# Patient Record
Sex: Female | Born: 1965 | Race: White | Hispanic: No | Marital: Married | Smoking: Never smoker
Health system: Southern US, Community
[De-identification: ages and names within clinical notes are randomized; demographics above are authoritative.]

## PROBLEM LIST (undated history)

## (undated) DIAGNOSIS — L309 Dermatitis, unspecified: Secondary | ICD-10-CM

## (undated) DIAGNOSIS — T7840XA Allergy, unspecified, initial encounter: Secondary | ICD-10-CM

## (undated) DIAGNOSIS — M25569 Pain in unspecified knee: Secondary | ICD-10-CM

## (undated) HISTORY — DX: Dermatitis, unspecified: L30.9

## (undated) HISTORY — DX: Allergy, unspecified, initial encounter: T78.40XA

## (undated) HISTORY — DX: Pain in unspecified knee: M25.569

---

## 1997-07-19 ENCOUNTER — Other Ambulatory Visit: Admission: RE | Admit: 1997-07-19 | Discharge: 1997-07-19 | Payer: Self-pay | Admitting: Obstetrics and Gynecology

## 1998-05-14 ENCOUNTER — Other Ambulatory Visit: Admission: RE | Admit: 1998-05-14 | Discharge: 1998-05-14 | Payer: Self-pay | Admitting: Obstetrics and Gynecology

## 1998-12-17 ENCOUNTER — Inpatient Hospital Stay (HOSPITAL_COMMUNITY): Admission: AD | Admit: 1998-12-17 | Discharge: 1998-12-17 | Payer: Self-pay | Admitting: Obstetrics and Gynecology

## 1998-12-18 ENCOUNTER — Inpatient Hospital Stay (HOSPITAL_COMMUNITY): Admission: AD | Admit: 1998-12-18 | Discharge: 1998-12-20 | Payer: Self-pay | Admitting: Obstetrics and Gynecology

## 1999-01-23 ENCOUNTER — Other Ambulatory Visit: Admission: RE | Admit: 1999-01-23 | Discharge: 1999-01-23 | Payer: Self-pay | Admitting: Obstetrics and Gynecology

## 2000-03-20 ENCOUNTER — Encounter: Admission: RE | Admit: 2000-03-20 | Discharge: 2000-03-20 | Payer: Self-pay | Admitting: Obstetrics and Gynecology

## 2000-03-20 ENCOUNTER — Encounter: Payer: Self-pay | Admitting: Obstetrics and Gynecology

## 2001-04-21 ENCOUNTER — Other Ambulatory Visit: Admission: RE | Admit: 2001-04-21 | Discharge: 2001-04-21 | Payer: Self-pay | Admitting: Obstetrics and Gynecology

## 2002-06-07 ENCOUNTER — Other Ambulatory Visit: Admission: RE | Admit: 2002-06-07 | Discharge: 2002-06-07 | Payer: Self-pay | Admitting: Obstetrics and Gynecology

## 2003-06-28 ENCOUNTER — Other Ambulatory Visit: Admission: RE | Admit: 2003-06-28 | Discharge: 2003-06-28 | Payer: Self-pay | Admitting: Obstetrics and Gynecology

## 2004-07-04 ENCOUNTER — Other Ambulatory Visit: Admission: RE | Admit: 2004-07-04 | Discharge: 2004-07-04 | Payer: Self-pay | Admitting: Obstetrics and Gynecology

## 2007-07-09 ENCOUNTER — Encounter: Admission: RE | Admit: 2007-07-09 | Discharge: 2007-07-09 | Payer: Self-pay | Admitting: Obstetrics and Gynecology

## 2007-12-21 ENCOUNTER — Encounter: Admission: RE | Admit: 2007-12-21 | Discharge: 2007-12-21 | Payer: Self-pay | Admitting: Obstetrics and Gynecology

## 2008-07-12 ENCOUNTER — Encounter: Admission: RE | Admit: 2008-07-12 | Discharge: 2008-07-12 | Payer: Self-pay | Admitting: Obstetrics and Gynecology

## 2012-10-14 ENCOUNTER — Ambulatory Visit (INDEPENDENT_AMBULATORY_CARE_PROVIDER_SITE_OTHER): Payer: BC Managed Care – PPO | Admitting: Physician Assistant

## 2012-10-14 ENCOUNTER — Encounter: Payer: Self-pay | Admitting: Physician Assistant

## 2012-10-14 VITALS — BP 120/71 | HR 80 | Temp 99.1°F | Ht 61.0 in | Wt 124.0 lb

## 2012-10-14 DIAGNOSIS — S40861S Insect bite (nonvenomous) of right upper arm, sequela: Secondary | ICD-10-CM

## 2012-10-14 DIAGNOSIS — L089 Local infection of the skin and subcutaneous tissue, unspecified: Secondary | ICD-10-CM

## 2012-10-14 DIAGNOSIS — L0291 Cutaneous abscess, unspecified: Secondary | ICD-10-CM

## 2012-10-14 DIAGNOSIS — IMO0002 Reserved for concepts with insufficient information to code with codable children: Secondary | ICD-10-CM

## 2012-10-14 DIAGNOSIS — L0390 Cellulitis, unspecified: Secondary | ICD-10-CM

## 2012-10-14 MED ORDER — HYDROXYZINE HCL 25 MG PO TABS: 25.0000 mg | ORAL_TABLET | Freq: Three times a day (TID) | ORAL | Status: DC | PRN

## 2012-10-14 MED ORDER — DOXYCYCLINE HYCLATE 100 MG PO TABS: 100.0000 mg | ORAL_TABLET | Freq: Two times a day (BID) | ORAL | Status: DC

## 2012-10-14 MED ORDER — MUPIROCIN 2 % EX OINT: TOPICAL_OINTMENT | Freq: Three times a day (TID) | CUTANEOUS | Status: DC

## 2012-10-14 NOTE — Progress Notes (Signed)
  Subjective:    Patient ID: Deanna Lawson, female    DOB: [birth year], 47 y.o.   MRN: [id number]  HPI 47 y/o female presents for pruritic and painful lesion on LUE that is worsening. She started having symptoms yesterday and does not recall a recent insect bite or trauma. She has tried cleaning with peroxide and using Neosporin with no relief. 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     Review of Systems Denies recent insect bite, fever, chills, sweats, nausea, vomiting, diarrhea. States that she had an isolated headache Monday. Endorses localized upper L arm pain, swelling and erythema.       Objective:   Physical Exam Localized erythema and edema of L arm, proximal to elbow with center ulceration and crusting. Localized area Warm to palpation. Negative for SOB, wheezing, respiratory distress or other systemic symptoms.         Assessment & Plan:  1. Presumed insect bite with cellulitis: Bacterial culture taken to r/o community acquired MRSA. Prescribed Doxycycline 100mg  BID x 10 days for empirical treatment along with Mupirocin OIntment for topical impetigo. Hydroxyzine 25mg  for symptomatic relief of pruritis. Patient will RTC if s/s worsen or persist.

## 2012-10-16 LAB — AEROBIC CULTURE

## 2013-03-05 ENCOUNTER — Ambulatory Visit (INDEPENDENT_AMBULATORY_CARE_PROVIDER_SITE_OTHER): Payer: BC Managed Care – PPO | Admitting: General Practice

## 2013-03-05 VITALS — BP 110/59 | HR 76 | Temp 97.0°F | Ht 61.0 in | Wt 120.0 lb

## 2013-03-05 DIAGNOSIS — J0100 Acute maxillary sinusitis, unspecified: Secondary | ICD-10-CM

## 2013-03-05 DIAGNOSIS — J029 Acute pharyngitis, unspecified: Secondary | ICD-10-CM

## 2013-03-05 LAB — POCT RAPID STREP A (OFFICE): Rapid Strep A Screen: NEGATIVE

## 2013-03-05 MED ORDER — AZITHROMYCIN 250 MG PO TABS: ORAL_TABLET | ORAL | Status: DC

## 2013-03-05 NOTE — Progress Notes (Signed)
   Subjective:    Patient ID: Deanna Lawson, female    DOB: Oct 10, 1965, 47 y.o.   MRN: [id number]  Sinusitis This is a new problem. The current episode started in the past 7 days. The problem has been gradually worsening since onset. There has been no fever. Associated symptoms include coughing, ear pain, sinus pressure, sneezing and a sore throat. Pertinent negatives include no chills or shortness of breath. Past treatments include nothing.      Review of Systems  Constitutional: Negative for fever and chills.  HENT: Positive for ear pain, sinus pressure, sneezing and sore throat.   Respiratory: Positive for cough. Negative for chest tightness and shortness of breath.   Cardiovascular: Negative for chest pain and palpitations.  All other systems reviewed and are negative.       Objective:   Physical Exam  Constitutional: She is oriented to person, place, and time. She appears well-developed and well-nourished.  HENT:  Head: Normocephalic.  Right Ear: External ear normal.  Left Ear: External ear normal.  Nose: Right sinus exhibits maxillary sinus tenderness. Left sinus exhibits maxillary sinus tenderness.  Cardiovascular: Normal rate, regular rhythm and normal heart sounds.   Pulmonary/Chest: Effort normal and breath sounds normal. No respiratory distress. She exhibits no tenderness.  Neurological: She is alert and oriented to person, place, and time.  Skin: Skin is warm and dry.  Psychiatric: She has a normal mood and affect.    Results for orders placed in visit on 03/05/13  POCT RAPID STREP A (OFFICE)      Result Value Range   Rapid Strep A Screen Negative  Negative        Assessment & Plan:  1. Acute pharyngitis  - POCT rapid strep A  2. Sinusitis, acute maxillary  - azithromycin (ZITHROMAX) 250 MG tablet; Take as directed  Dispense: 6 tablet; Refill: 0 -Increase fluid intake Motrin or tylenol OTC OTC decongestant Throat lozenges if help Proper  handwashing Patient verbalized understanding Coralie Keens, FNP-C

## 2013-03-05 NOTE — Patient Instructions (Signed)

## 2013-08-26 ENCOUNTER — Other Ambulatory Visit: Payer: Self-pay | Admitting: Obstetrics and Gynecology

## 2013-08-26 DIAGNOSIS — R928 Other abnormal and inconclusive findings on diagnostic imaging of breast: Secondary | ICD-10-CM

## 2013-09-09 ENCOUNTER — Ambulatory Visit
Admission: RE | Admit: 2013-09-09 | Discharge: 2013-09-09 | Disposition: A | Discharge status: Home / Self Care | Payer: BC Managed Care – PPO | Source: Ambulatory Visit | Attending: Obstetrics and Gynecology | Admitting: Obstetrics and Gynecology

## 2013-09-09 DIAGNOSIS — R928 Other abnormal and inconclusive findings on diagnostic imaging of breast: Secondary | ICD-10-CM

## 2014-04-11 ENCOUNTER — Encounter (INDEPENDENT_AMBULATORY_CARE_PROVIDER_SITE_OTHER): Payer: Self-pay

## 2014-04-11 ENCOUNTER — Encounter: Payer: Self-pay | Admitting: Family Medicine

## 2014-04-11 ENCOUNTER — Ambulatory Visit (INDEPENDENT_AMBULATORY_CARE_PROVIDER_SITE_OTHER): Payer: BLUE CROSS/BLUE SHIELD | Admitting: Family Medicine

## 2014-04-11 VITALS — BP 110/63 | HR 79 | Temp 98.1°F | Ht 61.0 in | Wt 124.8 lb

## 2014-04-11 DIAGNOSIS — R21 Rash and other nonspecific skin eruption: Secondary | ICD-10-CM

## 2014-04-11 DIAGNOSIS — J0100 Acute maxillary sinusitis, unspecified: Secondary | ICD-10-CM

## 2014-04-11 MED ORDER — BETAMETHASONE DIPROPIONATE 0.05 % EX CREA: TOPICAL_CREAM | Freq: Two times a day (BID) | CUTANEOUS | Status: DC

## 2014-04-11 MED ORDER — LEVOFLOXACIN 500 MG PO TABS: 500.0000 mg | ORAL_TABLET | Freq: Every day | ORAL | Status: DC

## 2014-04-11 NOTE — Progress Notes (Signed)
   Subjective:    Patient ID: Deanna Lawson, female    DOB: [birth year], 49 y.o.   MRN: [id number]  HPI Patient is here for c/o uri sx's and left facial pain.  She has a rash on her arms and she states she uses betamethasone cream and would like a refill.  Review of Systems  Constitutional: Negative for fever.  HENT: Negative for ear pain.   Eyes: Negative for discharge.  Respiratory: Negative for cough.   Cardiovascular: Negative for chest pain.  Gastrointestinal: Negative for abdominal distention.  Endocrine: Negative for polyuria.  Genitourinary: Negative for difficulty urinating.  Musculoskeletal: Negative for gait problem and neck pain.  Skin: Negative for color change and rash.  Neurological: Negative for speech difficulty and headaches.  Psychiatric/Behavioral: Negative for agitation.       Objective:    BP 110/63 mmHg  Pulse 79  Temp(Src) 98.1 F (36.7 C) (Oral)  Ht 5\' 1"  (1.549 m)  Wt 124 lb 12.8 oz (56.609 kg)  BMI 23.59 kg/m2 Physical Exam  Constitutional: She is oriented to person, place, and time. She appears well-developed and well-nourished.  HENT:  Head: Normocephalic and atraumatic.  Mouth/Throat: Oropharynx is clear and moist.  Eyes: Pupils are equal, round, and reactive to light.  Neck: Normal range of motion. Neck supple.  Cardiovascular: Normal rate and regular rhythm.   No murmur heard. Pulmonary/Chest: Effort normal and breath sounds normal.  Abdominal: Soft. Bowel sounds are normal. There is no tenderness.  Neurological: She is alert and oriented to person, place, and time.  Skin: Skin is warm and dry.  Psychiatric: She has a normal mood and affect.          Assessment & Plan:     ICD-9-CM ICD-10-CM   1. Subacute maxillary sinusitis 461.0 J01.00 levofloxacin (LEVAQUIN) 500 MG tablet  2. Rash and nonspecific skin eruption 782.1 R21 betamethasone dipropionate (DIPROLENE) 0.05 % cream     Return if symptoms worsen or fail to  improve.  Deatra CanterWilliam J Laren Orama FNP

## 2015-05-12 ENCOUNTER — Encounter: Payer: Self-pay | Admitting: Nurse Practitioner

## 2015-05-12 ENCOUNTER — Ambulatory Visit (INDEPENDENT_AMBULATORY_CARE_PROVIDER_SITE_OTHER): Payer: BC Managed Care – PPO | Admitting: Nurse Practitioner

## 2015-05-12 VITALS — BP 124/69 | HR 75 | Temp 97.5°F | Ht 61.0 in | Wt 127.0 lb

## 2015-05-12 DIAGNOSIS — J029 Acute pharyngitis, unspecified: Secondary | ICD-10-CM | POA: Diagnosis not present

## 2015-05-12 DIAGNOSIS — J069 Acute upper respiratory infection, unspecified: Secondary | ICD-10-CM

## 2015-05-12 LAB — POCT INFLUENZA A/B
Influenza A, POC: POSITIVE — AB
Influenza B, POC: NEGATIVE

## 2015-05-12 LAB — POCT RAPID STREP A (OFFICE): Rapid Strep A Screen: NEGATIVE

## 2015-05-12 MED ORDER — AZITHROMYCIN 250 MG PO TABS: ORAL_TABLET | ORAL | Status: DC

## 2015-05-12 MED ORDER — OSELTAMIVIR PHOSPHATE 75 MG PO CAPS: 75.0000 mg | ORAL_CAPSULE | Freq: Two times a day (BID) | ORAL | Status: DC

## 2015-05-12 NOTE — Patient Instructions (Signed)
Upper Respiratory Infection, Adult Most upper respiratory infections (URIs) are a viral infection of the air passages leading to the lungs. A URI affects the nose, throat, and upper air passages. The most common type of URI is nasopharyngitis and is typically referred to as "the common cold." URIs run their course and usually go away on their own. Most of the time, a URI does not require medical attention, but sometimes a bacterial infection in the upper airways can follow a viral infection. This is called a secondary infection. Sinus and middle ear infections are common types of secondary upper respiratory infections. Bacterial pneumonia can also complicate a URI. A URI can worsen asthma and chronic obstructive pulmonary disease (COPD). Sometimes, these complications can require emergency medical care and may be life threatening.  CAUSES Almost all URIs are caused by viruses. A virus is a type of germ and can spread from one person to another.  RISKS FACTORS You may be at risk for a URI if:   You smoke.   You have chronic heart or lung disease.  You have a weakened defense (immune) system.   You are very young or very old.   You have nasal allergies or asthma.  You work in crowded or poorly ventilated areas.  You work in health care facilities or schools. SIGNS AND SYMPTOMS  Symptoms typically develop 2-3 days after you come in contact with a cold virus. Most viral URIs last 7-10 days. However, viral URIs from the influenza virus (flu virus) can last 14-18 days and are typically more severe. Symptoms may include:   Runny or stuffy (congested) nose.   Sneezing.   Cough.   Sore throat.   Headache.   Fatigue.   Fever.   Loss of appetite.   Pain in your forehead, behind your eyes, and over your cheekbones (sinus pain).  Muscle aches.  DIAGNOSIS  Your health care provider may diagnose a URI by:  Physical exam.  Tests to check that your symptoms are not due to  another condition such as:  Strep throat.  Sinusitis.  Pneumonia.  Asthma. TREATMENT  A URI goes away on its own with time. It cannot be cured with medicines, but medicines may be prescribed or recommended to relieve symptoms. Medicines may help:  Reduce your fever.  Reduce your cough.  Relieve nasal congestion. HOME CARE INSTRUCTIONS   Take medicines only as directed by your health care provider.   Gargle warm saltwater or take cough drops to comfort your throat as directed by your health care provider.  Use a warm mist humidifier or inhale steam from a shower to increase air moisture. This may make it easier to breathe.  Drink enough fluid to keep your urine clear or pale yellow.   Eat soups and other clear broths and maintain good nutrition.   Rest as needed.   Return to work when your temperature has returned to normal or as your health care provider advises. You may need to stay home longer to avoid infecting others. You can also use a face mask and careful hand washing to prevent spread of the virus.  Increase the usage of your inhaler if you have asthma.   Do not use any tobacco products, including cigarettes, chewing tobacco, or electronic cigarettes. If you need help quitting, ask your health care provider. PREVENTION  The best way to protect yourself from getting a cold is to practice good hygiene.   Avoid oral or hand contact with people with cold   symptoms.   Wash your hands often if contact occurs.  There is no clear evidence that vitamin C, vitamin E, echinacea, or exercise reduces the chance of developing a cold. However, it is always recommended to get plenty of rest, exercise, and practice good nutrition.  SEEK MEDICAL CARE IF:   You are getting worse rather than better.   Your symptoms are not controlled by medicine.   You have chills.  You have worsening shortness of breath.  You have brown or red mucus.  You have yellow or brown nasal  discharge.  You have pain in your face, especially when you bend forward.  You have a fever.  You have swollen neck glands.  You have pain while swallowing.  You have white areas in the back of your throat. SEEK IMMEDIATE MEDICAL CARE IF:   You have severe or persistent:  Headache.  Ear pain.  Sinus pain.  Chest pain.  You have chronic lung disease and any of the following:  Wheezing.  Prolonged cough.  Coughing up blood.  A change in your usual mucus.  You have a stiff neck.  You have changes in your:  Vision.  Hearing.  Thinking.  Mood. MAKE SURE YOU:   Understand these instructions.  Will watch your condition.  Will get help right away if you are not doing well or get worse.   This information is not intended to replace advice given to you by your health care provider. Make sure you discuss any questions you have with your health care provider.   Document Released: 08/27/2000 Document Revised: 07/18/2014 Document Reviewed: 06/08/2013 Elsevier Interactive Patient Education 2016 Elsevier Inc.  

## 2015-05-12 NOTE — Progress Notes (Signed)
Subjective:    Patient ID: Deanna Lawson, female    DOB: Jul 30, 1965, 50 y.o.   MRN: [id number]   Patient in c/o:  Sore Throat  This is a new problem. The current episode started in the past 7 days. The problem has been gradually improving. Neither side of throat is experiencing more pain than the other. The maximum temperature recorded prior to her arrival was 101 - 101.9 F (fever started yesterday). The pain is at a severity of 6/10. The pain is moderate. Associated symptoms include congestion, coughing and a hoarse voice. Pertinent negatives include no ear discharge or shortness of breath. She has tried NSAIDs for the symptoms. The treatment provided moderate relief.      Review of Systems  Constitutional: Positive for fever.  HENT: Positive for congestion and hoarse voice. Negative for ear discharge.   Respiratory: Positive for cough. Negative for shortness of breath.   Cardiovascular: Negative.   Genitourinary: Negative.   Neurological: Negative.   Psychiatric/Behavioral: Negative.   All other systems reviewed and are negative.      Objective:   Physical Exam  Constitutional: She appears well-developed and well-nourished. No distress.  HENT:  Right Ear: Hearing, tympanic membrane, external ear and ear canal normal.  Left Ear: Hearing, tympanic membrane, external ear and ear canal normal.  Nose: Mucosal edema and rhinorrhea present. Right sinus exhibits no maxillary sinus tenderness and no frontal sinus tenderness. Left sinus exhibits no maxillary sinus tenderness and no frontal sinus tenderness.  Mouth/Throat: Uvula is midline, oropharynx is clear and moist and mucous membranes are normal.  Neck: Normal range of motion. Neck supple.  Cardiovascular: Normal rate, regular rhythm and normal heart sounds.   Pulmonary/Chest: Effort normal and breath sounds normal.  Musculoskeletal: Normal range of motion.  Neurological: She is alert.  Skin: Skin is warm.  Psychiatric: She has  a normal mood and affect. Her behavior is normal. Judgment and thought content normal.   BP 124/69 mmHg  Pulse 75  Temp(Src) 97.5 F (36.4 C) (Oral)  Ht  (1.549 m)  Wt 127 lb (57.607 kg)  BMI 24.01 kg/m2       Assessment & Plan:   1. Sore throat   2. Acute upper respiratory infection   3. Influenza  1. Take meds as prescribed 2. Use a cool mist humidifier especially during the winter months and when heat has been humid. 3. Use saline nose sprays frequently 4. Saline irrigations of the nose can be very helpful if done frequently.  * 4X daily for 1 week*  * Use of a nettie pot can be helpful with this. Follow directions with this* 5. Drink plenty of fluids 6. Keep thermostat turn down low 7.For any cough or congestion  Use plain Mucinex- regular strength or max strength is fine   * Children- consult with Pharmacist for dosing 8. For fever or aces or pains- take tylenol or ibuprofen appropriate for age and weight.  * for fevers greater than 101 orally you may alternate ibuprofen and tylenol every  3 hours.   Meds ordered this encounter  Medications  . azithromycin (ZITHROMAX Z-PAK) 250 MG tablet    Sig: As directed    Dispense:  1 each    Refill:  0    Order Specific Question:  Supervising Provider    Answer:  Ernestina Penna [[id number]]  . oseltamivir (TAMIFLU) 75 MG capsule    Sig: Take 1 capsule (75 mg total) by mouth 2 (two)  times daily.    Dispense:  10 capsule    Refill:  0    Order Specific Question:  Supervising Provider    Answer:  Ernestina Penna [[id number]]     Mary-Margaret Daphine Deutscher, FNP

## 2015-05-14 NOTE — Addendum Note (Signed)
Addended by: Bennie Pierini on: 05/14/2015 08:47 AM   Modules accepted: Level of Service, SmartSet

## 2015-12-28 ENCOUNTER — Other Ambulatory Visit: Payer: Self-pay | Admitting: Obstetrics and Gynecology

## 2015-12-28 DIAGNOSIS — R928 Other abnormal and inconclusive findings on diagnostic imaging of breast: Secondary | ICD-10-CM

## 2016-01-09 ENCOUNTER — Ambulatory Visit
Admission: RE | Admit: 2016-01-09 | Discharge: 2016-01-09 | Disposition: A | Discharge status: Home / Self Care | Payer: BC Managed Care – PPO | Source: Ambulatory Visit | Attending: Obstetrics and Gynecology | Admitting: Obstetrics and Gynecology

## 2016-01-09 DIAGNOSIS — R928 Other abnormal and inconclusive findings on diagnostic imaging of breast: Secondary | ICD-10-CM

## 2017-02-19 ENCOUNTER — Encounter: Payer: Self-pay | Admitting: Physician Assistant

## 2017-02-19 ENCOUNTER — Ambulatory Visit: Payer: BC Managed Care – PPO | Admitting: Physician Assistant

## 2017-02-19 VITALS — BP 115/71 | HR 72 | Temp 97.6°F | Ht 61.0 in | Wt 134.6 lb

## 2017-02-19 DIAGNOSIS — K1370 Unspecified lesions of oral mucosa: Secondary | ICD-10-CM

## 2017-02-19 MED ORDER — PREDNISONE 10 MG (21) PO TBPK: ORAL_TABLET | ORAL | 0 refills | Status: DC

## 2017-02-19 MED ORDER — CEPHALEXIN 500 MG PO CAPS: 500.0000 mg | ORAL_CAPSULE | Freq: Four times a day (QID) | ORAL | 0 refills | Status: DC

## 2017-02-19 NOTE — Patient Instructions (Signed)
Canker Sores Canker sores are small, painful sores that develop inside your mouth. They may also be called aphthous ulcers. You can get canker sores on the inside of your lips or cheeks, on your tongue, or anywhere inside your mouth. You can have just one canker sore or several of them. Canker sores cannot be passed from one person to another (noncontagious). These sores are different than the sores that you may get on the outside of your lips (cold sores or fever blisters). Canker sores usually start as painful red bumps. Then they turn into small white, yellow, or gray ulcers that have red borders. The ulcers may be quite painful. The pain may be worse when you eat or drink. What are the causes? The cause of this condition is not known. What increases the risk? This condition is more likely to develop in:  Women.  People in their teens or 20s.  Women who are having their menstrual period.  People who are under a lot of emotional stress.  People who do not get enough iron or B vitamins.  People who have poor oral hygiene.  People who have an injury inside the mouth. This can happen after having dental work or from chewing something hard.  What are the signs or symptoms? Along with the canker sore, symptoms may also include:  Fever.  Fatigue.  Swollen lymph nodes in your neck.  How is this diagnosed? This condition can be diagnosed based on your symptoms. Your health care provider will also examine your mouth. Your health care provider may also do tests if you get canker sores often or if they are very bad. Tests may include:  Blood tests to rule out other causes of canker sores.  Taking swabs from the sore to check for infection.  Taking a small piece of skin from the sore (biopsy) to test it for cancer.  How is this treated? Most canker sores clear up without treatment in about 10 days. Home care is usually the only treatment that you will need. Over-the-counter medicines  can relieve discomfort.If you have severe canker sores, your health care provider may prescribe:  Numbing ointment to relieve pain.  Vitamins.  Steroid medicines. These may be given as: ? Oral pills. ? Mouth rinses. ? Gels.  Antibiotic mouth rinse.  Follow these instructions at home:  Apply, take, or use medicines only as directed by your health care provider. These include vitamins.  If you were prescribed an antibiotic mouth rinse, finish all of it even if you start to feel better.  Until the sores are healed: ? Do not drink coffee or citrus juices. ? Do not eat spicy or salty foods.  Use a mild, over-the-counter mouth rinse as directed by your health care provider.  Practice good oral hygiene. ? Floss your teeth every day. ? Brush your teeth with a soft brush twice each day. Contact a health care provider if:  Your symptoms do not get better after two weeks.  You also have a fever or swollen glands.  You get canker sores often.  You have a canker sore that is getting larger.  You cannot eat or drink due to your canker sores. This information is not intended to replace advice given to you by your health care provider. Make sure you discuss any questions you have with your health care provider. Document Released: 06/28/2010 Document Revised: 08/09/2015 Document Reviewed: 02/01/2014 Elsevier Interactive Patient Education  2018 Elsevier Inc.  

## 2017-02-24 NOTE — Progress Notes (Signed)
BP 115/71   Pulse 72   Temp 97.6 F (36.4 C) (Oral)   Ht 5\' 1"  (1.549 m)   Wt 134 lb 9.6 oz (61.1 kg)   BMI 25.43 kg/m    Subjective:    Patient ID: Deanna Lawson, female    DOB: [birth year], 51 y.o.   MRN: [id number]  HPI: Deanna DominionKaren M Deterding is a 51 y.o. female presenting on 02/19/2017 for Mouth Lesions (right side of mouth, but also swollen on outside of mouth & sensitive to touch for 3-4 days, no known fever)  Has had areas in the past but have never lasted this long. Denies any prior infections or other respiratory symptoms.  Has tried OTC without help. Pain is quite severe.  Relevant past medical, surgical, family and social history reviewed and updated as indicated. Allergies and medications reviewed and updated.  Past Medical History:  Diagnosis Date  . Dermatitis   . Knee pain    Left    History reviewed. No pertinent surgical history.  Review of Systems  Constitutional: Negative.  Negative for fatigue and fever.  HENT: Positive for mouth sores. Negative for congestion, hearing loss, sinus pressure and voice change.   Eyes: Negative.   Respiratory: Negative.   Cardiovascular: Negative.   Gastrointestinal: Negative.   Genitourinary: Negative.     Allergies as of 02/19/2017      Reactions   Bee Venom Swelling   Sulfa Antibiotics Rash      Medication List        Accurate as of 02/19/17 11:59 PM. Always use your most recent med list.          cephALEXin 500 MG capsule Commonly known as:  KEFLEX Take 1 capsule (500 mg total) by mouth 4 (four) times daily.   cetirizine 10 MG tablet Commonly known as:  ZYRTEC Take 10 mg by mouth daily.   predniSONE 10 MG (21) Tbpk tablet Commonly known as:  STERAPRED UNI-PAK 21 TAB As directed x 6 days   triamcinolone ointment 0.1 % Commonly known as:  KENALOG Apply 1 application topically 2 (two) times daily. During flare-up          Objective:    BP 115/71   Pulse 72   Temp 97.6 F (36.4 C) (Oral)   Ht 5'  1" (1.549 m)   Wt 134 lb 9.6 oz (61.1 kg)   BMI 25.43 kg/m   Allergies  Allergen Reactions  . Bee Venom Swelling  . Sulfa Antibiotics Rash    Physical Exam  Constitutional: She is oriented to person, place, and time. She appears well-developed and well-nourished.  HENT:  Head: Normocephalic and atraumatic.  Mouth/Throat: Oropharynx is clear and moist. Oral lesions present. No lacerations.    Eyes: Conjunctivae and EOM are normal. Pupils are equal, round, and reactive to light.  Cardiovascular: Normal rate, regular rhythm, normal heart sounds and intact distal pulses.  Pulmonary/Chest: Effort normal and breath sounds normal.  Abdominal: Soft. Bowel sounds are normal.  Neurological: She is alert and oriented to person, place, and time. She has normal reflexes.  Skin: Skin is warm and dry. No rash noted.  Psychiatric: She has a normal mood and affect. Her behavior is normal. Judgment and thought content normal.        Assessment & Plan:   1. Mouth lesion - triamcinolone ointment (KENALOG) 0.1 %; Apply 1 application topically 2 (two) times daily. During flare-up - cephALEXin (KEFLEX) 500 MG capsule; Take 1 capsule (500 mg  total) by mouth 4 (four) times daily.  Dispense: 40 capsule; Refill: 0 - predniSONE (STERAPRED UNI-PAK 21 TAB) 10 MG (21) TBPK tablet; As directed x 6 days  Dispense: 21 tablet; Refill: 0    Current Outpatient Medications:  .  cephALEXin (KEFLEX) 500 MG capsule, Take 1 capsule (500 mg total) by mouth 4 (four) times daily., Disp: 40 capsule, Rfl: 0 .  cetirizine (ZYRTEC) 10 MG tablet, Take 10 mg by mouth daily., Disp: , Rfl:  .  predniSONE (STERAPRED UNI-PAK 21 TAB) 10 MG (21) TBPK tablet, As directed x 6 days, Disp: 21 tablet, Rfl: 0 .  triamcinolone ointment (KENALOG) 0.1 %, Apply 1 application topically 2 (two) times daily. During flare-up, Disp: , Rfl:  Continue all other maintenance medications as listed above.  Follow up plan: Follow-up as needed or  worsening of symptoms. Call office for any issues.  Educational handout given for aphthous ulcer  Remus LofflerAngel S. Alasdair Kleve PA-C [facility] [address]  [place], [place] [address] [phone]   02/24/2017, 3:52 PM

## 2017-03-11 ENCOUNTER — Other Ambulatory Visit: Payer: Self-pay | Admitting: Obstetrics and Gynecology

## 2017-03-11 DIAGNOSIS — R928 Other abnormal and inconclusive findings on diagnostic imaging of breast: Secondary | ICD-10-CM

## 2017-03-18 ENCOUNTER — Ambulatory Visit
Admission: RE | Admit: 2017-03-18 | Discharge: 2017-03-18 | Disposition: A | Discharge status: Home / Self Care | Payer: BC Managed Care – PPO | Source: Ambulatory Visit | Attending: Obstetrics and Gynecology | Admitting: Obstetrics and Gynecology

## 2017-03-18 DIAGNOSIS — R928 Other abnormal and inconclusive findings on diagnostic imaging of breast: Secondary | ICD-10-CM

## 2017-05-09 ENCOUNTER — Encounter: Payer: Self-pay | Admitting: Physician Assistant

## 2017-05-09 ENCOUNTER — Ambulatory Visit: Payer: BC Managed Care – PPO | Admitting: Physician Assistant

## 2017-05-09 VITALS — BP 119/81 | HR 84 | Temp 98.5°F | Ht 61.0 in | Wt 136.6 lb

## 2017-05-09 DIAGNOSIS — J0100 Acute maxillary sinusitis, unspecified: Secondary | ICD-10-CM | POA: Diagnosis not present

## 2017-05-09 DIAGNOSIS — J029 Acute pharyngitis, unspecified: Secondary | ICD-10-CM

## 2017-05-09 MED ORDER — AMOXICILLIN-POT CLAVULANATE 875-125 MG PO TABS: 1.0000 | ORAL_TABLET | Freq: Two times a day (BID) | ORAL | 0 refills | Status: DC

## 2017-05-11 LAB — CULTURE, GROUP A STREP

## 2017-05-11 LAB — RAPID STREP SCREEN (MED CTR MEBANE ONLY): Strep Gp A Ag, IA W/Reflex: NEGATIVE

## 2017-05-11 NOTE — Progress Notes (Signed)
BP 119/81   Pulse 84   Temp 98.5 F (36.9 C) (Oral)   Ht 5\' 1"  (1.549 m)   Wt 136 lb 9.6 oz (62 kg)   BMI 25.81 kg/m    Subjective:    Patient ID: Deanna Lawson, female    DOB: 08-01-1965, 52 y.o.   MRN: [id number]  HPI: Deanna Lawson is a 52 y.o. female presenting on 05/09/2017 for Facial Pain and Sore Throat  This patient has had many days of sinus headache and postnasal drainage. There is copious drainage at times. Denies any fever at this time. There has been a history of sinus infections in the past.  No history of sinus surgery. There is cough at night. It has become more prevalent in recent days.   Past Medical History:  Diagnosis Date  . Dermatitis   . Knee pain    Left   Relevant past medical, surgical, family and social history reviewed and updated as indicated. Interim medical history since our last visit reviewed. Allergies and medications reviewed and updated. DATA REVIEWED: CHART IN EPIC  Family History reviewed for pertinent findings.  Review of Systems  Constitutional: Positive for chills and fatigue. Negative for activity change and appetite change.  HENT: Positive for congestion, postnasal drip and sore throat.   Eyes: Negative.   Respiratory: Positive for cough and wheezing.   Cardiovascular: Negative.  Negative for chest pain, palpitations and leg swelling.  Gastrointestinal: Negative.   Genitourinary: Negative.   Musculoskeletal: Negative.   Skin: Negative.   Neurological: Positive for headaches.    Allergies as of 05/09/2017      Reactions   Bee Venom Swelling   Sulfa Antibiotics Rash      Medication List        Accurate as of 05/09/17 11:59 PM. Always use your most recent med list.          amoxicillin-clavulanate 875-125 MG tablet Commonly known as:  AUGMENTIN Take 1 tablet by mouth 2 (two) times daily.   cetirizine 10 MG tablet Commonly known as:  ZYRTEC Take 10 mg by mouth daily.   triamcinolone ointment 0.1 % Commonly  known as:  KENALOG Apply 1 application topically 2 (two) times daily. During flare-up          Objective:    BP 119/81   Pulse 84   Temp 98.5 F (36.9 C) (Oral)   Ht 5\' 1"  (1.549 m)   Wt 136 lb 9.6 oz (62 kg)   BMI 25.81 kg/m   Allergies  Allergen Reactions  . Bee Venom Swelling  . Sulfa Antibiotics Rash    Wt Readings from Last 3 Encounters:  05/09/17 136 lb 9.6 oz (62 kg)  02/19/17 134 lb 9.6 oz (61.1 kg)  05/12/15 127 lb (57.6 kg)    Physical Exam  Constitutional: She is oriented to person, place, and time. She appears well-developed and well-nourished.  HENT:  Head: Normocephalic and atraumatic.  Right Ear: Tympanic membrane and external ear normal. No middle ear effusion.  Left Ear: Tympanic membrane and external ear normal.  No middle ear effusion.  Nose: Mucosal edema and rhinorrhea present. Right sinus exhibits no maxillary sinus tenderness. Left sinus exhibits no maxillary sinus tenderness.  Mouth/Throat: Uvula is midline. Posterior oropharyngeal erythema present.  Eyes: Conjunctivae and EOM are normal. Pupils are equal, round, and reactive to light. Right eye exhibits no discharge. Left eye exhibits no discharge.  Neck: Normal range of motion.  Cardiovascular: Normal rate, regular  rhythm and normal heart sounds.  Pulmonary/Chest: Effort normal and breath sounds normal. No respiratory distress. She has no wheezes.  Abdominal: Soft.  Lymphadenopathy:    She has no cervical adenopathy.  Neurological: She is alert and oriented to person, place, and time.  Skin: Skin is warm and dry.  Psychiatric: She has a normal mood and affect.    Results for orders placed or performed in visit on 05/09/17  Rapid Strep Screen ([facility] at [facility])  Result Value Ref Range   Strep Gp A Ag, IA W/Reflex Negative Negative  Culture, Group A Strep  Result Value Ref Range   Strep A Culture Comment   Culture, Group A Strep  Result Value Ref Range   Strep A Culture CANCELED         Assessment & Plan:   1. Sore throat - Rapid Strep Screen ([facility]) - Culture, Group A Strep - Culture, Group A Strep  2. Acute non-recurrent maxillary sinusitis - amoxicillin-clavulanate (AUGMENTIN) 875-125 MG tablet; Take 1 tablet by mouth 2 (two) times daily.  Dispense: 20 tablet; Refill: 0   Continue all other maintenance medications as listed above.  Follow up plan: No Follow-up on file.  Educational handout given for survey  Remus LofflerAngel S. Evaan Tidwell PA-C [facility] [address]  [place], [place] [address] [phone]   05/11/2017, 10:06 PM

## 2017-05-12 LAB — CULTURE, GROUP A STREP: Strep A Culture: NEGATIVE

## 2017-05-25 ENCOUNTER — Ambulatory Visit: Payer: BC Managed Care – PPO | Admitting: Family Medicine

## 2017-05-25 ENCOUNTER — Encounter: Payer: Self-pay | Admitting: Family Medicine

## 2017-05-25 VITALS — BP 127/85 | HR 82 | Temp 98.8°F | Ht 61.0 in | Wt 136.0 lb

## 2017-05-25 DIAGNOSIS — J0111 Acute recurrent frontal sinusitis: Secondary | ICD-10-CM | POA: Diagnosis not present

## 2017-05-25 MED ORDER — METHYLPREDNISOLONE ACETATE 80 MG/ML IJ SUSP
80.0000 mg | Freq: Once | INTRAMUSCULAR | Status: AC
  Administered 2017-05-25: 80 mg via INTRAMUSCULAR

## 2017-05-25 MED ORDER — CEFTRIAXONE SODIUM 1 G IJ SOLR
1.0000 g | Freq: Once | INTRAMUSCULAR | Status: AC
  Administered 2017-05-25: 1 g via INTRAMUSCULAR

## 2017-05-25 NOTE — Progress Notes (Signed)
BP 127/85   Pulse 82   Temp 98.8 F (37.1 C) (Oral)   Ht 5\' 1"  (1.549 m)   Wt 136 lb (61.7 kg)   BMI 25.70 kg/m    Subjective:    Patient ID: Deanna Lawson, female    DOB: 30-Oct-1965, 52 y.o.   MRN: [id number]  HPI: Deanna DominionKaren M Urton is a 52 y.o. female presenting on 05/25/2017 for Sinus congestion, bloody nose, pressure in right side of fac (has appointment with allergist on 06/04/17, wasn't sure if this was bacterial)   HPI Congestion and sinus pressure and drainage Patient comes in with congestion and sinus pressure and drainage that is been going on for the past 1 week.  She says she was treated with Augmentin 2 weeks ago and she finished it a week and 2 days ago and then she started up with a sinus pressure again 1 week ago.  She denies any fevers or chills but has had a lot of headaches and pressure especially above and below her right eye and she has had thick purulent drainage and bloody drainage as well.  She has been using her Flonase and her allergy pill and she does not feel like they have been helping.  She says she feels like it is worsening.  She denies any sick contacts that she knows of.  She does also have an allergist who she sees and gets allergy shots.  She did get better on the Augmentin but then it came right back.  Relevant past medical, surgical, family and social history reviewed and updated as indicated. Interim medical history since our last visit reviewed. Allergies and medications reviewed and updated.  Review of Systems  Constitutional: Negative for chills and fever.  HENT: Positive for congestion, postnasal drip, rhinorrhea, sinus pressure, sneezing and sore throat. Negative for ear discharge and ear pain.   Eyes: Negative for pain, redness and visual disturbance.  Respiratory: Positive for cough. Negative for chest tightness and shortness of breath.   Cardiovascular: Negative for chest pain and leg swelling.  Genitourinary: Negative for difficulty  urinating and dysuria.  Musculoskeletal: Negative for back pain and gait problem.  Skin: Negative for rash.  Neurological: Negative for light-headedness and headaches.  Psychiatric/Behavioral: Negative for agitation and behavioral problems.  All other systems reviewed and are negative.   Per HPI unless specifically indicated above   Allergies as of 05/25/2017      Reactions   Bee Venom Swelling   Sulfa Antibiotics Rash      Medication List        Accurate as of 05/25/17  4:26 PM. Always use your most recent med list.          cetirizine 10 MG tablet Commonly known as:  ZYRTEC Take 10 mg by mouth daily.   fluticasone 50 MCG/ACT nasal spray Commonly known as:  FLONASE Place 2 sprays into both nostrils daily.   triamcinolone ointment 0.1 % Commonly known as:  KENALOG Apply 1 application topically 2 (two) times daily. During flare-up          Objective:    BP 127/85   Pulse 82   Temp 98.8 F (37.1 C) (Oral)   Ht 5\' 1"  (1.549 m)   Wt 136 lb (61.7 kg)   BMI 25.70 kg/m   Wt Readings from Last 3 Encounters:  05/25/17 136 lb (61.7 kg)  05/09/17 136 lb 9.6 oz (62 kg)  02/19/17 134 lb 9.6 oz (61.1 kg)  Physical Exam  Constitutional: She is oriented to person, place, and time. She appears well-developed and well-nourished. No distress.  HENT:  Right Ear: Tympanic membrane, external ear and ear canal normal.  Left Ear: Tympanic membrane, external ear and ear canal normal.  Nose: Mucosal edema and rhinorrhea present. No epistaxis. Right sinus exhibits maxillary sinus tenderness and frontal sinus tenderness. Left sinus exhibits no maxillary sinus tenderness and no frontal sinus tenderness.  Mouth/Throat: Uvula is midline and mucous membranes are normal. Posterior oropharyngeal edema and posterior oropharyngeal erythema present. No oropharyngeal exudate or tonsillar abscesses.  Eyes: Conjunctivae and EOM are normal.  Cardiovascular: Normal rate, regular rhythm, normal  heart sounds and intact distal pulses.  No murmur heard. Pulmonary/Chest: Effort normal and breath sounds normal. No respiratory distress. She has no wheezes. She has no rales.  Musculoskeletal: Normal range of motion. She exhibits no edema or tenderness.  Neurological: She is alert and oriented to person, place, and time. Coordination normal.  Skin: Skin is warm and dry. No rash noted. She is not diaphoretic.  Psychiatric: She has a normal mood and affect. Her behavior is normal.  Vitals reviewed.       Assessment & Plan:   Problem List Items Addressed This Visit    None    Visit Diagnoses    Acute recurrent frontal sinusitis    -  Primary   Relevant Medications   fluticasone (FLONASE) 50 MCG/ACT nasal spray   methylPREDNISolone acetate (DEPO-MEDROL) injection 80 mg (Completed)   cefTRIAXone (ROCEPHIN) injection 1 g       Follow up plan: Return if symptoms worsen or fail to improve.  Counseling provided for all of the vaccine components No orders of the defined types were placed in this encounter.   Arville Care, MD [facility] 05/25/2017, 4:26 PM

## 2017-09-29 ENCOUNTER — Encounter: Payer: Self-pay | Admitting: Physician Assistant

## 2017-09-29 ENCOUNTER — Ambulatory Visit: Payer: BC Managed Care – PPO | Admitting: Physician Assistant

## 2017-09-29 VITALS — BP 121/81 | HR 78 | Ht 61.0 in | Wt 136.6 lb

## 2017-09-29 DIAGNOSIS — H1033 Unspecified acute conjunctivitis, bilateral: Secondary | ICD-10-CM

## 2017-09-29 MED ORDER — OFLOXACIN 0.3 % OP SOLN: 1.0000 [drp] | Freq: Four times a day (QID) | OPHTHALMIC | 0 refills | Status: DC

## 2017-09-29 NOTE — Progress Notes (Signed)
BP 121/81   Pulse 78   Ht 5\' 1"  (1.549 m)   Wt 136 lb 9.6 oz (62 kg)   BMI 25.81 kg/m    Subjective:    Patient ID: Deanna Lawson, female    DOB: 02-23-1966, 52 y.o.   MRN: [id number]  HPI: Deanna Lawson is a 52 y.o. female presenting on 09/29/2017 for Conjunctivitis (or possible allergies )  This patient reports waking up this morning having her left eye matted and watery.  It is painful.  It is continued to have drainage today.  She has a significant history of allergies.  She has had pinkeye in the past.  She feels that the right eye is starting to have the same symptoms.  She denies any fever or chills.  Past Medical History:  Diagnosis Date  . Dermatitis   . Knee pain    Left   Relevant past medical, surgical, family and social history reviewed and updated as indicated. Interim medical history since our last visit reviewed. Allergies and medications reviewed and updated. DATA REVIEWED: CHART IN EPIC  Family History reviewed for pertinent findings.  Review of Systems  Constitutional: Negative for fatigue and fever.  HENT: Negative.   Eyes: Positive for photophobia, pain, discharge and redness. Negative for visual disturbance.  Respiratory: Negative.   Gastrointestinal: Negative.   Genitourinary: Negative.     Allergies as of 09/29/2017      Reactions   Bee Venom Swelling   Sulfa Antibiotics Rash      Medication List        Accurate as of 09/29/17  3:07 PM. Always use your most recent med list.          fexofenadine 180 MG tablet Commonly known as:  ALLEGRA Take 180 mg by mouth daily.   fluticasone 50 MCG/ACT nasal spray Commonly known as:  FLONASE Place 2 sprays into both nostrils daily.   ofloxacin 0.3 % ophthalmic solution Commonly known as:  OCUFLOX Place 1 drop into both eyes 4 (four) times daily.   triamcinolone ointment 0.1 % Commonly known as:  KENALOG Apply 1 application topically 2 (two) times daily. During flare-up            Objective:    BP 121/81   Pulse 78   Ht 5\' 1"  (1.549 m)   Wt 136 lb 9.6 oz (62 kg)   BMI 25.81 kg/m   Allergies  Allergen Reactions  . Bee Venom Swelling  . Sulfa Antibiotics Rash    Wt Readings from Last 3 Encounters:  09/29/17 136 lb 9.6 oz (62 kg)  05/25/17 136 lb (61.7 kg)  05/09/17 136 lb 9.6 oz (62 kg)    Physical Exam  Constitutional: She is oriented to person, place, and time. She appears well-developed and well-nourished.  HENT:  Head: Normocephalic and atraumatic.  Eyes: Pupils are equal, round, and reactive to light. EOM are normal. Left eye exhibits discharge. Left eye exhibits no exudate and no hordeolum. No foreign body present in the left eye. Right conjunctiva is not injected. Left conjunctiva is injected.    Cardiovascular: Normal rate, regular rhythm, normal heart sounds and intact distal pulses.  Pulmonary/Chest: Effort normal and breath sounds normal.  Abdominal: Soft. Bowel sounds are normal.  Neurological: She is alert and oriented to person, place, and time. She has normal reflexes.  Skin: Skin is warm and dry. No rash noted.  Psychiatric: She has a normal mood and affect. Her behavior is  normal. Judgment and thought content normal.        Assessment & Plan:   1. Acute bacterial conjunctivitis of both eyes Ocuflox opth drops QID   Continue all other maintenance medications as listed above.  Follow up plan: No follow-ups on file.  Educational handout given for survey  Remus LofflerAngel S. Zohaib Heeney PA-C [facility] [address]  [place], [place] [address] [phone]   09/29/2017, 3:07 PM

## 2017-10-07 ENCOUNTER — Encounter: Payer: Self-pay | Admitting: Physician Assistant

## 2017-10-07 ENCOUNTER — Ambulatory Visit (INDEPENDENT_AMBULATORY_CARE_PROVIDER_SITE_OTHER): Payer: BC Managed Care – PPO

## 2017-10-07 ENCOUNTER — Ambulatory Visit: Payer: BC Managed Care – PPO | Admitting: Physician Assistant

## 2017-10-07 VITALS — BP 115/77 | HR 73 | Temp 98.8°F | Ht 61.0 in | Wt 135.8 lb

## 2017-10-07 DIAGNOSIS — M25572 Pain in left ankle and joints of left foot: Secondary | ICD-10-CM

## 2017-10-07 NOTE — Progress Notes (Signed)
BP 115/77   Pulse 73   Temp 98.8 F (37.1 C) (Oral)   Ht 5\' 1"  (1.549 m)   Wt 135 lb 12.8 oz (61.6 kg)   BMI 25.66 kg/m    Subjective:    Patient ID: Deanna Lawson, female    DOB: December 07, 1965, 52 y.o.   MRN: [id number]  HPI: Deanna DominionKaren M Oftedahl is a 52 y.o. female presenting on 10/07/2017 for Ankle Pain (left )  Yesterday this patient was going from a low sitting position to standing and felt in her left ankle a pop.  It was on the lateral side.  She did use ice and ibuprofen last night.  It did get more more sore as the day went on.  This morning it is somewhat stiff.  She has had mild sprains to this ankle all of her life.  She has never had to have surgery.  Past Medical History:  Diagnosis Date  . Dermatitis   . Knee pain    Left   Relevant past medical, surgical, family and social history reviewed and updated as indicated. Interim medical history since our last visit reviewed. Allergies and medications reviewed and updated. DATA REVIEWED: CHART IN EPIC  Family History reviewed for pertinent findings.  Review of Systems  Constitutional: Negative.   HENT: Negative.   Eyes: Negative.   Respiratory: Negative.   Gastrointestinal: Negative.   Genitourinary: Negative.   Musculoskeletal: Positive for arthralgias and joint swelling.    Allergies as of 10/07/2017      Reactions   Bee Venom Swelling   Sulfa Antibiotics Rash      Medication List        Accurate as of 10/07/17  9:03 AM. Always use your most recent med list.          fexofenadine 180 MG tablet Commonly known as:  ALLEGRA Take 180 mg by mouth daily.   fluticasone 50 MCG/ACT nasal spray Commonly known as:  FLONASE Place 2 sprays into both nostrils daily.   ofloxacin 0.3 % ophthalmic solution Commonly known as:  OCUFLOX Place 1 drop into both eyes 4 (four) times daily.   triamcinolone ointment 0.1 % Commonly known as:  KENALOG Apply 1 application topically 2 (two) times daily. During flare-up            Objective:    BP 115/77   Pulse 73   Temp 98.8 F (37.1 C) (Oral)   Ht 5\' 1"  (1.549 m)   Wt 135 lb 12.8 oz (61.6 kg)   BMI 25.66 kg/m   Allergies  Allergen Reactions  . Bee Venom Swelling  . Sulfa Antibiotics Rash    Wt Readings from Last 3 Encounters:  10/07/17 135 lb 12.8 oz (61.6 kg)  09/29/17 136 lb 9.6 oz (62 kg)  05/25/17 136 lb (61.7 kg)    Physical Exam  Constitutional: She is oriented to person, place, and time. She appears well-developed and well-nourished.  HENT:  Head: Normocephalic and atraumatic.  Eyes: Pupils are equal, round, and reactive to light. Conjunctivae and EOM are normal.  Cardiovascular: Normal rate, regular rhythm, normal heart sounds and intact distal pulses.  Pulmonary/Chest: Effort normal and breath sounds normal.  Abdominal: Soft. Bowel sounds are normal.  Musculoskeletal:       Left ankle: She exhibits swelling. She exhibits normal range of motion. Tenderness.       Feet:  Neurological: She is alert and oriented to person, place, and time. She has normal reflexes.  Skin: Skin is warm and dry. No rash noted.  Psychiatric: She has a normal mood and affect. Her behavior is normal. Judgment and thought content normal.    Results for orders placed or performed in visit on 05/09/17  Rapid Strep Screen (Not at [facility])  Result Value Ref Range   Strep Gp A Ag, IA W/Reflex Negative Negative  Culture, Group A Strep  Result Value Ref Range   Strep A Culture Negative   Culture, Group A Strep  Result Value Ref Range   Strep A Culture CANCELED       Assessment & Plan:   1. Acute left ankle pain - DG Ankle Complete Left; Future Ankle brace ibuprofen  Continue all other maintenance medications as listed above.  Follow up plan: Return if symptoms worsen or fail to improve.  Educational handout given for survey  Remus Loffler PA-C [facility] [address]  [place], [place]  [address] [phone]   10/07/2017, 9:03 AM

## 2017-10-07 NOTE — Patient Instructions (Signed)
Ankle Sprain  An ankle sprain is a stretch or tear in one of the tough tissues (ligaments) in your ankle.  Follow these instructions at home:   Rest your ankle.   Take over-the-counter and prescription medicines only as told by your doctor.   For 2-3 days, keep your ankle higher than the level of your heart (elevated) as much as possible.   If directed, put ice on the area:  ? Put ice in a plastic bag.  ? Place a towel between your skin and the bag.  ? Leave the ice on for 20 minutes, 2-3 times a day.   If you were given a brace:  ? Wear it as told.  ? Take it off to shower or bathe.  ? Try not to move your ankle much, but wiggle your toes from time to time. This helps to prevent swelling.   If you were given an elastic bandage (dressing):  ? Take it off when you shower or bathe.  ? Try not to move your ankle much, but wiggle your toes from time to time. This helps to prevent swelling.  ? Adjust the bandage to make it more comfortable if it feels too tight.  ? Loosen the bandage if you lose feeling in your foot, your foot tingles, or your foot gets cold and blue.   If you have crutches, use them as told by your doctor. Continue to use them until you can walk without feeling pain in your ankle.  Contact a doctor if:   Your bruises or swelling are quickly getting worse.   Your pain does not get better after you take medicine.  Get help right away if:   You cannot feel your toes or foot.   Your toes or your foot looks blue.   You have very bad pain that gets worse.  This information is not intended to replace advice given to you by your health care provider. Make sure you discuss any questions you have with your health care provider.  Document Released: 08/20/2007 Document Revised: 08/09/2015 Document Reviewed: 10/03/2014  Elsevier Interactive Patient Education  2018 [employer]

## 2018-08-10 ENCOUNTER — Encounter (INDEPENDENT_AMBULATORY_CARE_PROVIDER_SITE_OTHER): Payer: Self-pay

## 2018-08-10 ENCOUNTER — Telehealth: Payer: Self-pay | Admitting: Physician Assistant

## 2019-02-04 ENCOUNTER — Other Ambulatory Visit: Payer: Self-pay

## 2019-02-04 DIAGNOSIS — Z20822 Contact with and (suspected) exposure to covid-19: Secondary | ICD-10-CM

## 2019-02-07 LAB — NOVEL CORONAVIRUS, NAA: SARS-CoV-2, NAA: NOT DETECTED

## 2019-02-14 IMAGING — MG 2D DIGITAL DIAGNOSTIC BILATERAL MAMMOGRAM WITH CAD AND ADJUNCT T
8 of 12 series · 8 of 28 positions shown · non-contrast
Comparison: March 03, 2017

CLINICAL DATA: 51-year-old patient had a screening mammogram
performed March 03, 2017. A possible mass was identified in the
right breast and a possible mass was identified in the left breast.
The patient presents for additional evaluation.

EXAM:
2D DIGITAL DIAGNOSTIC BILATERAL MAMMOGRAM WITH ADJUNCT TOMO
ULTRASOUND BILATERAL BREAST

[L CC synth-2D]
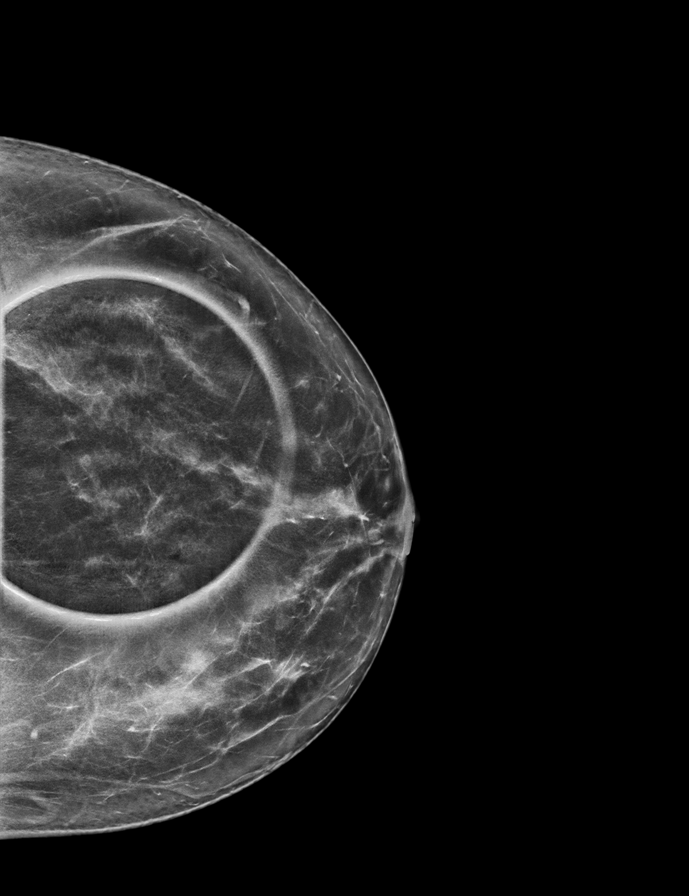

[L MLO]
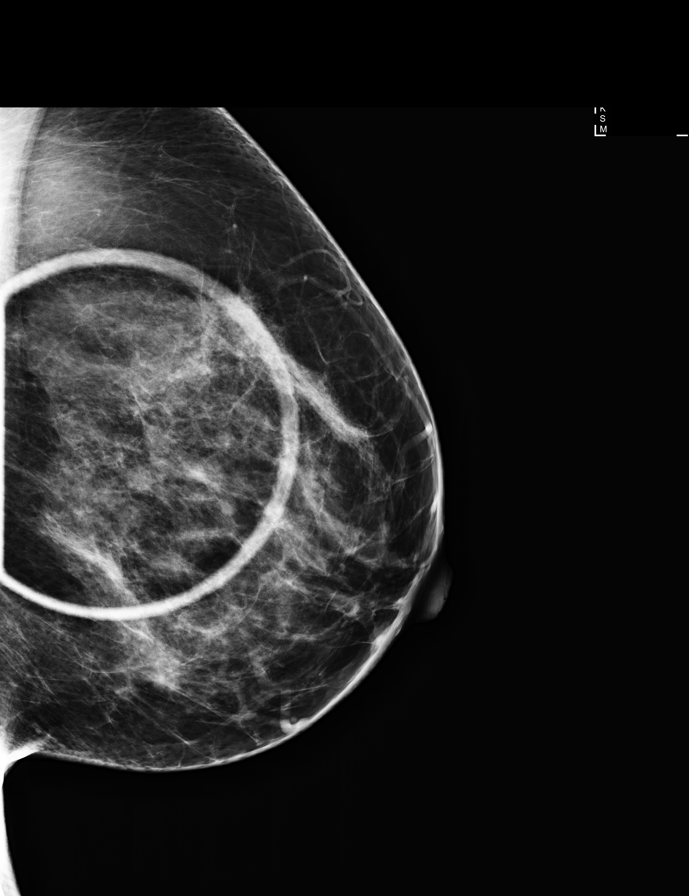

[R CC]
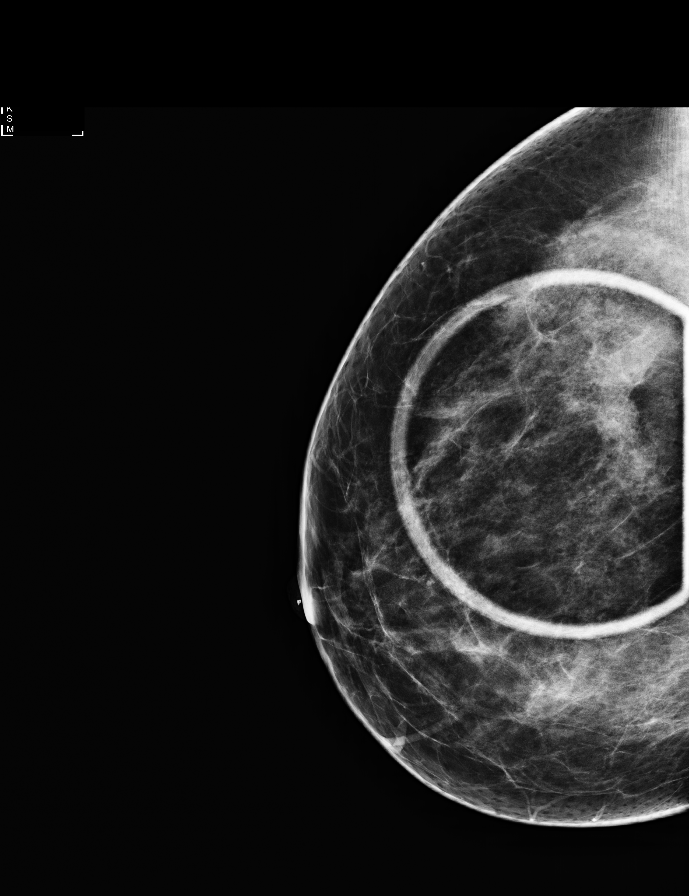

[L CC]
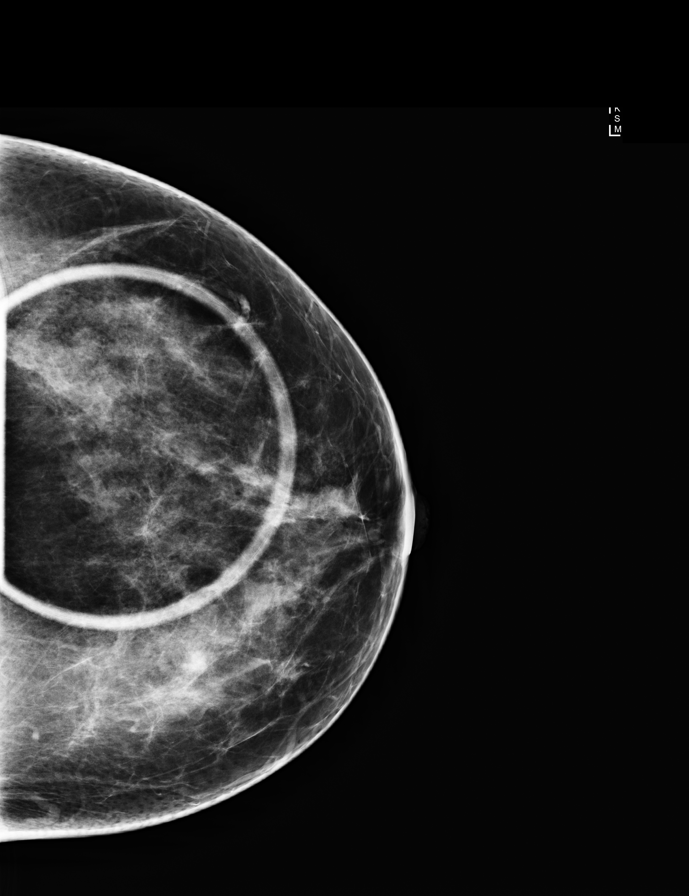

[R MLO]
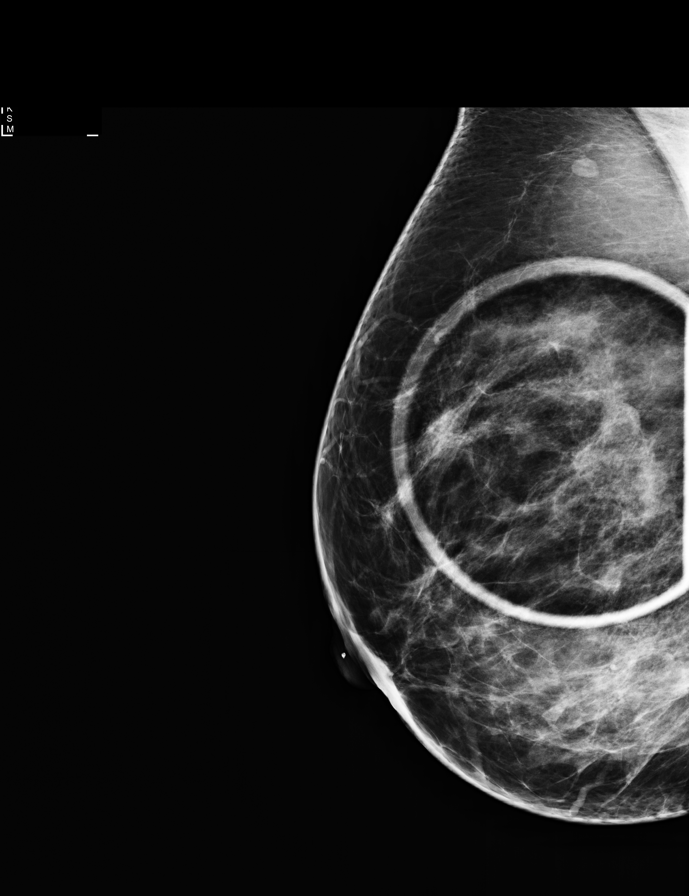

[R CC synth-2D]
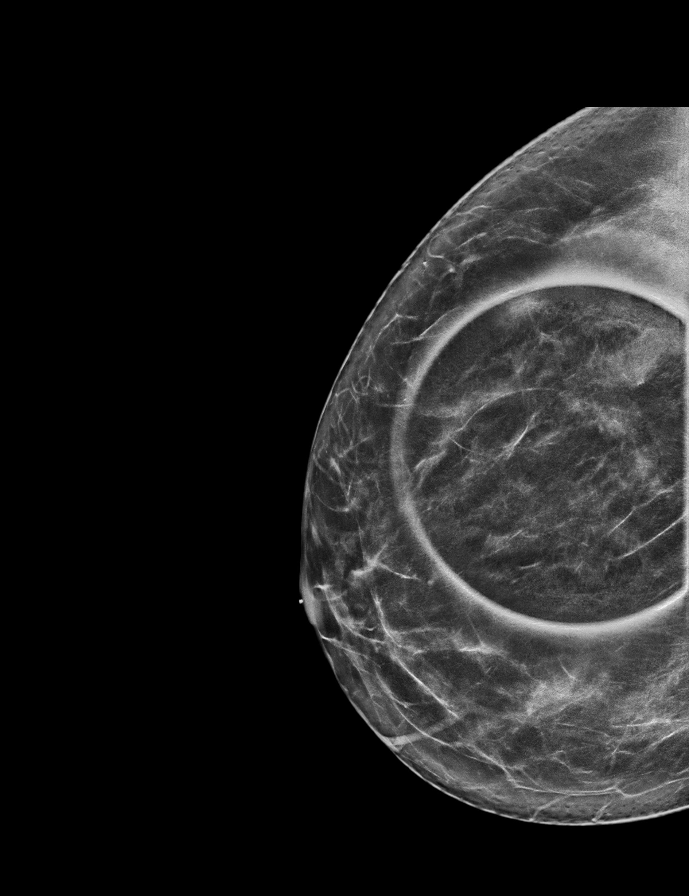

[L MLO synth-2D]
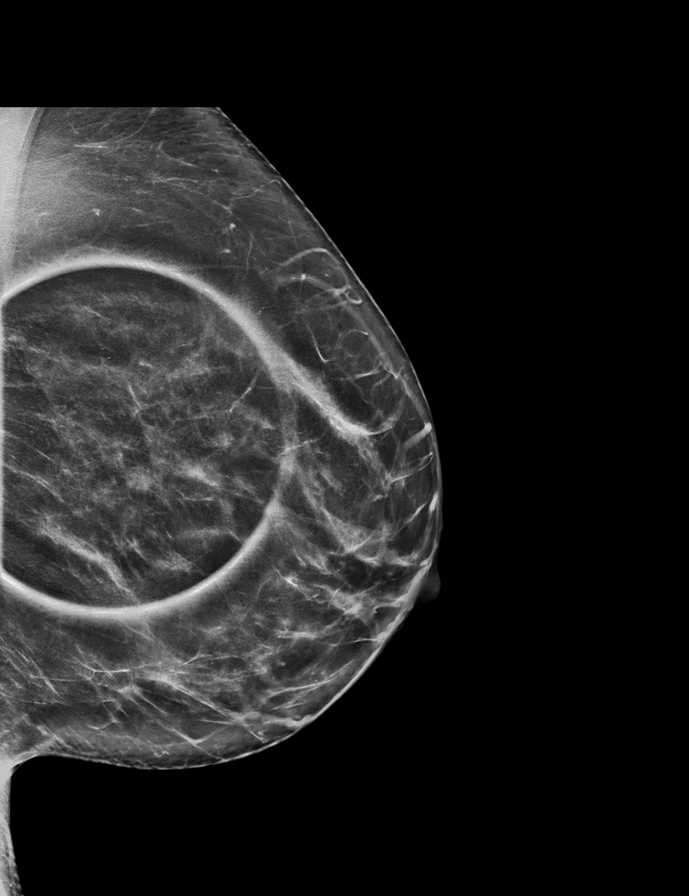

[R MLO synth-2D]
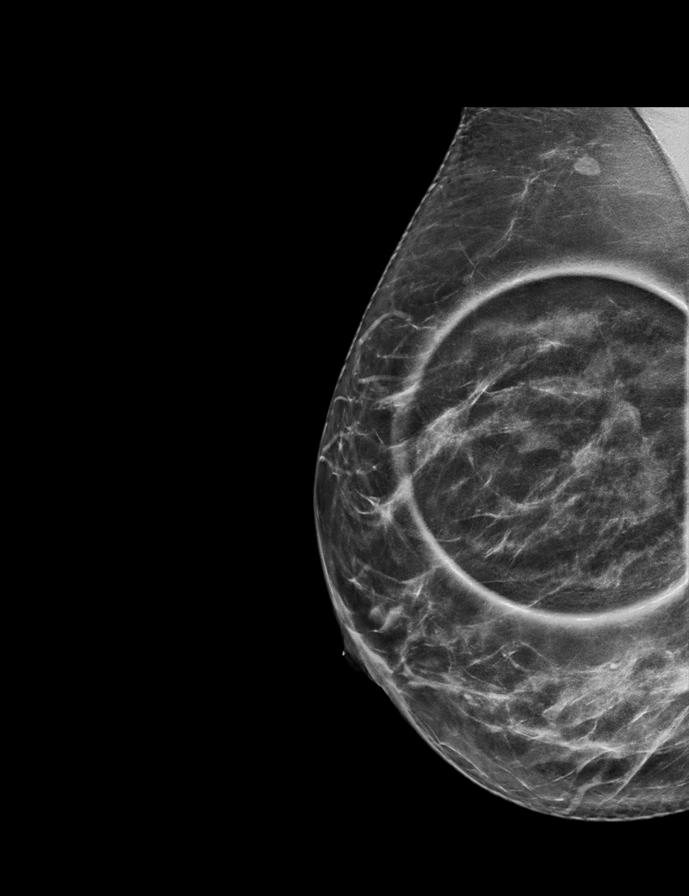

[8 of 28 positions shown; findings below may reference images not displayed]

ACR Breast Density Category b: There are scattered areas of
fibroglandular density.
FINDINGS: Focal spot compression views of the outer right breast, 9 o'clock
region, confirms a circumscribed posterior wall of a probable
approximately 5 mm oval mass. Focal spot compression views of the
outer left breast in the 3 o'clock region confirms an approximately
7 mm circumscribed oval mass.

Targeted ultrasound is performed, showing a 5 x 4 x 4 mm simple cyst
in the deep aspect of the [DATE] position of the right breast 7 cm
from the nipple. No suspicious mass is identified in the outer right
breast.

In the 3 o'clock position of the left breast 1 cm from the nipple is
a 7 x 8 x 6 mm cyst with a partial thin internal septation. No
suspicious mass is identified in the left breast on ultrasound.
IMPRESSION: Bilateral benign cysts, one identified in each breast, account for
the masses seen at mammography. No evidence of malignancy.

RECOMMENDATION:
Screening mammogram in one year.(Code:[id number])

I have discussed the findings and recommendations with the patient.
Results were also provided in writing at the conclusion of the
visit. If applicable, a reminder letter will be sent to the patient
regarding the next appointment.

BI-RADS CATEGORY  2: Benign.

## 2019-05-22 ENCOUNTER — Ambulatory Visit: Payer: BC Managed Care – PPO | Attending: Internal Medicine

## 2019-05-22 DIAGNOSIS — Z23 Encounter for immunization: Secondary | ICD-10-CM | POA: Insufficient documentation

## 2019-05-22 NOTE — Progress Notes (Signed)
   Covid-19 Vaccination Clinic  Name:  Deanna Lawson    MRN: [id number] DOB: [birth year]  05/22/2019  Deanna Lawson was observed post Covid-19 immunization for 15 minutes without incident. She was provided with Vaccine Information Sheet and instruction to access the V-Safe system.   Deanna Lawson was instructed to call 911 with any severe reactions post vaccine: Marland Kitchen Difficulty breathing  . Swelling of face and throat  . A fast heartbeat  . A bad rash all over body  . Dizziness and weakness   Immunizations Administered    Name Date Dose VIS Date Route   Pfizer COVID-19 Vaccine 05/22/2019  1:45 PM 0.3 mL 02/25/2019 Intramuscular   Manufacturer: [employer], [employer]   Lot: [id number]   NDC: [address]-0

## 2019-06-12 ENCOUNTER — Ambulatory Visit: Payer: Self-pay | Attending: Internal Medicine

## 2019-06-12 DIAGNOSIS — Z23 Encounter for immunization: Secondary | ICD-10-CM

## 2019-06-12 NOTE — Progress Notes (Signed)
   Covid-19 Vaccination Clinic  Name:  Deanna Lawson    MRN: [id number] DOB: [birth year]  06/12/2019  [name] was observed post Covid-19 immunization for 15 minutes without incident. She was provided with Vaccine Information Sheet and instruction to access the V-Safe system.   [name] was instructed to call 911 with any severe reactions post vaccine: Marland Kitchen Difficulty breathing  . Swelling of face and throat  . A fast heartbeat  . A bad rash all over body  . Dizziness and weakness   Immunizations Administered    Name Date Dose VIS Date Route   Pfizer COVID-19 Vaccine 06/12/2019 12:44 PM 0.3 mL 02/25/2019 Intramuscular   Manufacturer: [employer], [employer]   Lot: [id number]   NDC: [address]-0

## 2019-09-05 IMAGING — DX DG ANKLE COMPLETE 3+V*L*
3 series · 3 of 3 positions shown · non-contrast
Comparison: None.

CLINICAL DATA: Acute left ankle pain.  Heard ankle pop.  Swelling.

EXAM:
LEFT ANKLE COMPLETE - 3+ VIEW

[ankle ap]
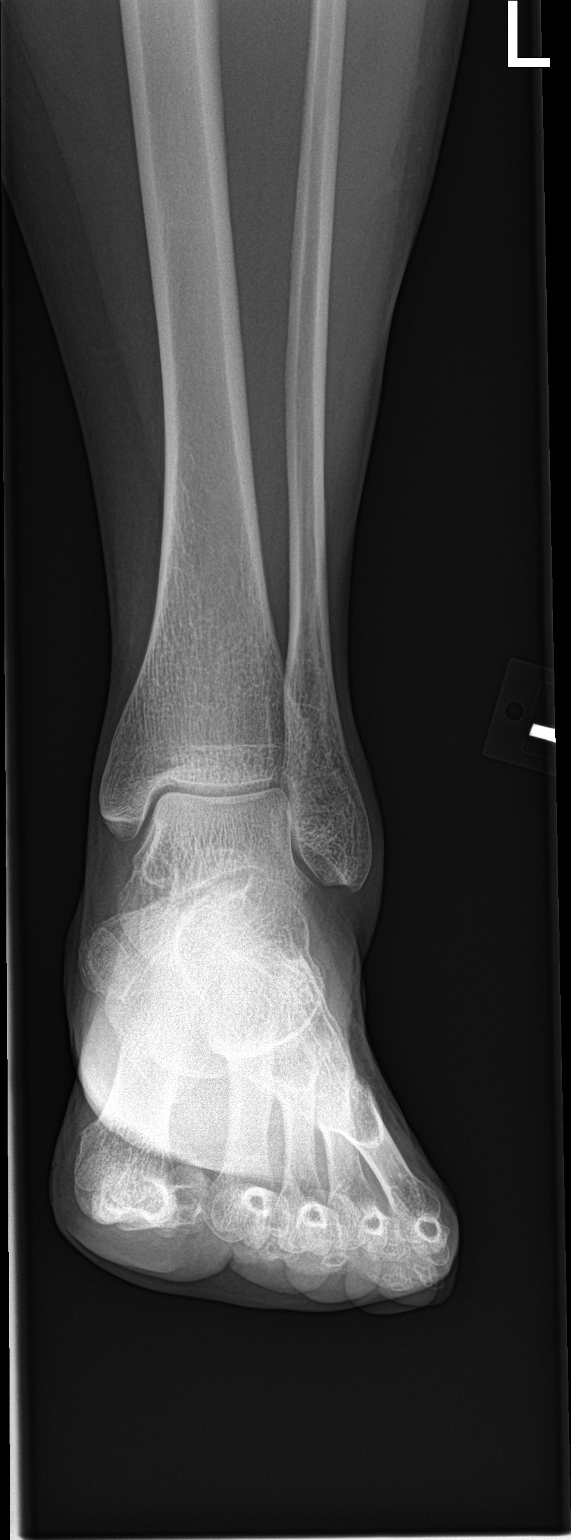

[ankle obl]
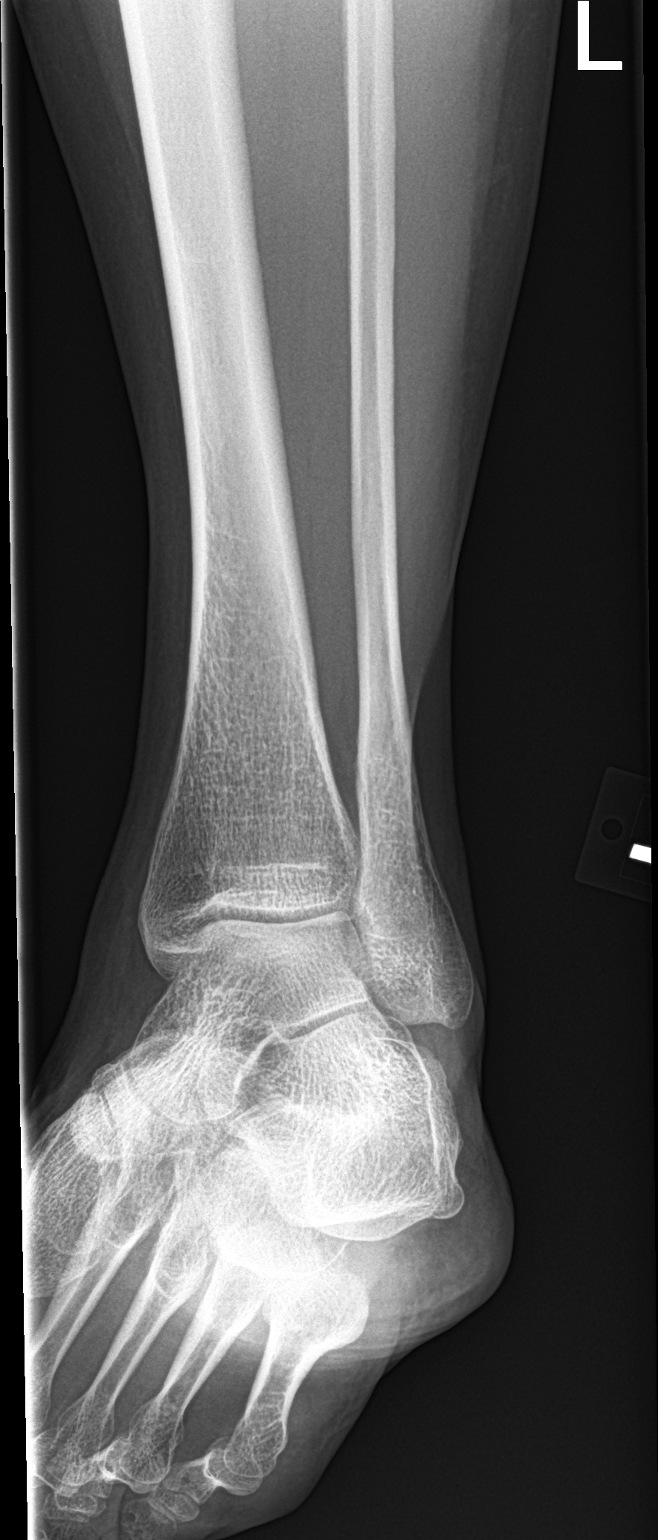

[ankle lat]
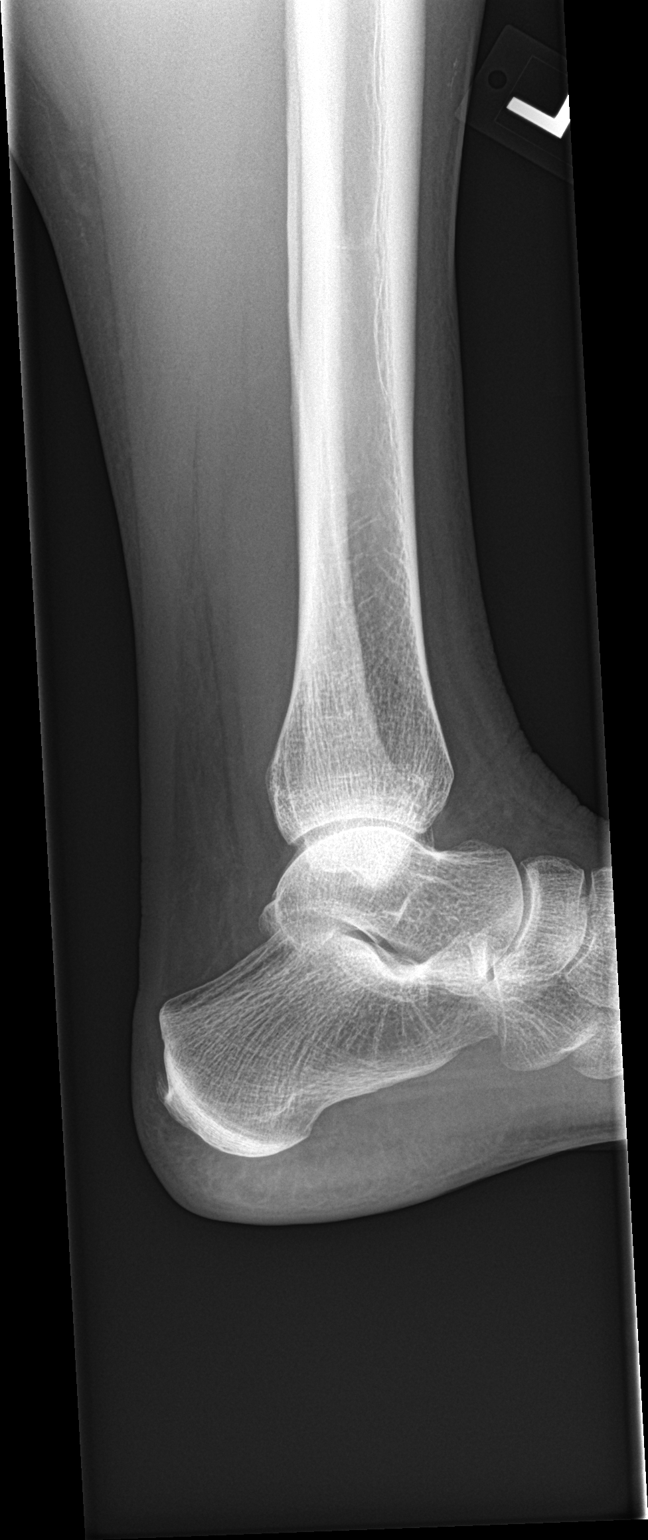

[3 of 3 positions shown; findings below may reference images not displayed]

FINDINGS: Mild soft tissue swelling is present over the lateral malleolus.
There is no underlying fracture. No radiopaque foreign body is
present. There is no joint effusion.
IMPRESSION: 1. Soft tissue swelling over the lateral malleolus without
underlying fracture. Ligamentous injury is not excluded.

## 2019-10-20 ENCOUNTER — Ambulatory Visit: Payer: BC Managed Care – PPO | Admitting: Family

## 2019-10-20 ENCOUNTER — Encounter: Payer: Self-pay | Admitting: Family

## 2019-10-20 ENCOUNTER — Other Ambulatory Visit: Payer: Self-pay

## 2019-10-20 VITALS — BP 113/72 | HR 77 | Temp 97.5°F | Ht 61.0 in | Wt 132.0 lb

## 2019-10-20 DIAGNOSIS — L089 Local infection of the skin and subcutaneous tissue, unspecified: Secondary | ICD-10-CM | POA: Diagnosis not present

## 2019-10-20 MED ORDER — MUPIROCIN 2 % EX OINT: 1.0000 "application " | TOPICAL_OINTMENT | Freq: Two times a day (BID) | CUTANEOUS | 1 refills | Status: DC

## 2019-10-20 NOTE — Progress Notes (Signed)
   Subjective:    Patient ID: Deanna Lawson, female    DOB: [birth year], 54 y.o.   MRN: [id number]  Chief Complaint  Patient presents with  . Acne    bump on face for 1 mth     HPI PT presents to the office today with a "bump" under right nostril that has been there for about a month. She reports the area will drain a slight amount of discharge but return with a a white head.   Have used alcohol and neosporin without relief.    Review of Systems  All other systems reviewed and are negative.      Objective:   Physical Exam Vitals reviewed.  Constitutional:      General: She is not in acute distress.    Appearance: She is well-developed.  HENT:     Head: Normocephalic and atraumatic.  Eyes:     Pupils: Pupils are equal, round, and reactive to light.  Neck:     Thyroid: No thyromegaly.  Cardiovascular:     Rate and Rhythm: Normal rate and regular rhythm.     Heart sounds: Normal heart sounds. No murmur heard.   Pulmonary:     Effort: Pulmonary effort is normal. No respiratory distress.     Breath sounds: Normal breath sounds. No wheezing.  Abdominal:     General: Bowel sounds are normal. There is no distension.     Palpations: Abdomen is soft.     Tenderness: There is no abdominal tenderness.  Musculoskeletal:        General: No tenderness. Normal range of motion.     Cervical back: Normal range of motion and neck supple.  Skin:    General: Skin is warm and dry.     Findings: Abscess present.          Comments: Saw pimple like under right nostril   Neurological:     Mental Status: She is alert and oriented to person, place, and time.     Cranial Nerves: No cranial nerve deficit.     Deep Tendon Reflexes: Reflexes are normal and symmetric.  Psychiatric:        Behavior: Behavior normal.        Thought Content: Thought content normal.        Judgment: Judgment normal.     Area cleaned, and small incision made. Scant amount of white discharge and moderate  amount of blood. Band aid applied. Pt tolerated well.   BP 113/72   Pulse 77   Temp (!) 97.5 F (36.4 C) (Temporal)   Ht 5\' 1"  (1.549 m)   Wt 132 lb (59.9 kg)   SpO2 97%   BMI 24.94 kg/m      Assessment & Plan:  1. Pustule Keep clean and dry Do not pick Apply Bactroban If area has increased tenderness, swelling, or discharge call and I will send in antibiotic.   - mupirocin ointment (BACTROBAN) 2 %; Apply 1 application topically 2 (two) times daily.  Dispense: 30 g; Refill: 1

## 2019-10-20 NOTE — Patient Instructions (Signed)
Skin Abscess  A skin abscess is an infected area on or under your skin that contains a collection of pus and other material. An abscess may also be called a furuncle, carbuncle, or boil. An abscess can occur in or on almost any part of your body. Some abscesses break open (rupture) on their own. Most continue to get worse unless they are treated. The infection can spread deeper into the body and eventually into your blood, which can make you feel ill. Treatment usually involves draining the abscess. What are the causes? An abscess occurs when germs, like bacteria, pass through your skin and cause an infection. This may be caused by:  A scrape or cut on your skin.  A puncture wound through your skin, including a needle injection or insect bite.  Blocked oil or sweat glands.  Blocked and infected hair follicles.  A cyst that forms beneath your skin (sebaceous cyst) and becomes infected. What increases the risk? This condition is more likely to develop in people who:  Have a weak body defense system (immune system).  Have diabetes.  Have dry and irritated skin.  Get frequent injections or use illegal IV drugs.  Have a foreign body in a wound, such as a splinter.  Have problems with their lymph system or veins. What are the signs or symptoms? Symptoms of this condition include:  A painful, firm bump under the skin.  A bump with pus at the top. This may break through the skin and drain. Other symptoms include:  Redness surrounding the abscess site.  Warmth.  Swelling of the lymph nodes (glands) near the abscess.  Tenderness.  A sore on the skin. How is this diagnosed? This condition may be diagnosed based on:  A physical exam.  Your medical history.  A sample of pus. This may be used to find out what is causing the infection.  Blood tests.  Imaging tests, such as an ultrasound, CT scan, or MRI. How is this treated? A small abscess that drains on its own may  not need treatment. Treatment for larger abscesses may include:  Moist heat or heat pack applied to the area several times a day.  A procedure to drain the abscess (incision and drainage).  Antibiotic medicines. For a severe abscess, you may first get antibiotics through an IV and then change to antibiotics by mouth. Follow these instructions at home: Medicines   Take over-the-counter and prescription medicines only as told by your health care provider.  If you were prescribed an antibiotic medicine, take it as told by your health care provider. Do not stop taking the antibiotic even if you start to feel better. Abscess care   If you have an abscess that has not drained, apply heat to the affected area. Use the heat source that your health care provider recommends, such as a moist heat pack or a heating pad. ? Place a towel between your skin and the heat source. ? Leave the heat on for 20-30 minutes. ? Remove the heat if your skin turns bright red. This is especially important if you are unable to feel pain, heat, or cold. You may have a greater risk of getting burned.  Follow instructions from your health care provider about how to take care of your abscess. Make sure you: ? Cover the abscess with a bandage (dressing). ? Change your dressing or gauze as told by your health care provider. ? Wash your hands with soap and water before you change the   dressing or gauze. If soap and water are not available, use hand sanitizer.  Check your abscess every day for signs of a worsening infection. Check for: ? More redness, swelling, or pain. ? More fluid or blood. ? Warmth. ? More pus or a bad smell. General instructions  To avoid spreading the infection: ? Do not share personal care items, towels, or hot tubs with others. ? Avoid making skin contact with other people.  Keep all follow-up visits as told by your health care provider. This is important. Contact a health care provider if  you have:  More redness, swelling, or pain around your abscess.  More fluid or blood coming from your abscess.  Warm skin around your abscess.  More pus or a bad smell coming from your abscess.  A fever.  Muscle aches.  Chills or a general ill feeling. Get help right away if you:  Have severe pain.  See red streaks on your skin spreading away from the abscess. Summary  A skin abscess is an infected area on or under your skin that contains a collection of pus and other material.  A small abscess that drains on its own may not need treatment.  Treatment for larger abscesses may include having a procedure to drain the abscess and taking an antibiotic. This information is not intended to replace advice given to you by your health care provider. Make sure you discuss any questions you have with your health care provider. Document Revised: 06/24/2018 Document Reviewed: 04/16/2017 Elsevier Patient Education  2020 [employer]  

## 2020-07-05 ENCOUNTER — Encounter: Payer: Self-pay | Admitting: Family

## 2020-07-05 ENCOUNTER — Telehealth: Payer: BC Managed Care – PPO | Admitting: Family

## 2020-07-05 DIAGNOSIS — W57XXXA Bitten or stung by nonvenomous insect and other nonvenomous arthropods, initial encounter: Secondary | ICD-10-CM

## 2020-07-05 DIAGNOSIS — S80862A Insect bite (nonvenomous), left lower leg, initial encounter: Secondary | ICD-10-CM | POA: Diagnosis not present

## 2020-07-05 MED ORDER — DOXYCYCLINE HYCLATE 100 MG PO TABS: 100.0000 mg | ORAL_TABLET | Freq: Two times a day (BID) | ORAL | 0 refills | Status: DC

## 2020-07-05 MED ORDER — PREDNISONE 10 MG (21) PO TBPK: ORAL_TABLET | ORAL | 0 refills | Status: DC

## 2020-07-05 NOTE — Progress Notes (Signed)
   Virtual Visit  Note Due to COVID-19 pandemic this visit was conducted virtually. This visit type was conducted due to national recommendations for restrictions regarding the COVID-19 Pandemic (e.g. social distancing, sheltering in place) in an effort to limit this patient's exposure and mitigate transmission in our community. All issues noted in this document were discussed and addressed.  A physical exam was not performed with this format.  I connected with Deanna Lawson on 07/05/20 at 12:02 pm                           by video and verified that I am speaking with the correct person using two identifiers. Deanna Lawson is currently located at work and no one is currently with her  during visit. The provider, Jannifer Rodney, FNP is located in their office at time of visit.  I discussed the limitations, risks, security and privacy concerns of performing an evaluation and management service by [place] and the availability of in person appointments. I also discussed with the patient that there may be a patient responsible charge related to this service. The patient expressed understanding and agreed to proceed.   History and Present Illness:  HPI  Pt calls the office today with an insect bite she notice on left lower leg 3 days ago. However, last night she noticed the area was increased redness, warmth, and pruritus.   She states her face is increased redness and pruritis and took a benadryl this AM.  This did help with the itching and redness.    Review of Systems  All other systems reviewed and are negative.    Observations/Objective:  No SOB or distress, left lower leg erythemas circular rash that is approx a small orange.   Assessment and Plan: 1. Insect bite, unspecified site, initial encounter Avoid scratching Cool compresses Report any increase in redness, fever, pain - predniSONE (STERAPRED UNI-PAK 21 TAB) 10 MG (21) TBPK tablet; Use as directed  Dispense: 21 tablet; Refill:  0 - doxycycline (VIBRA-TABS) 100 MG tablet; Take 1 tablet (100 mg total) by mouth 2 (two) times daily.  Dispense: 20 tablet; Refill: 0    I discussed the assessment and treatment plan with the patient. The patient was provided an opportunity to ask questions and all were answered. The patient agreed with the plan and demonstrated an understanding of the instructions.   The patient was advised to call back or seek an in-person evaluation if the symptoms worsen or if the condition fails to improve as anticipated.  The above assessment and management plan was discussed with the patient. The patient verbalized understanding of and has agreed to the management plan. Patient is aware to call the clinic if symptoms persist or worsen. Patient is aware when to return to the clinic for a follow-up visit. Patient educated on when it is appropriate to go to the emergency department.   Time call ended:  12:12 pm  I provided 10 minutes of  face-to-face time during this encounter.    Jannifer Rodney, FNP

## 2020-07-09 ENCOUNTER — Encounter: Payer: Self-pay | Admitting: Nurse Practitioner

## 2020-07-09 ENCOUNTER — Ambulatory Visit: Payer: BC Managed Care – PPO | Admitting: Nurse Practitioner

## 2020-07-09 ENCOUNTER — Other Ambulatory Visit: Payer: Self-pay

## 2020-07-09 VITALS — BP 112/67 | HR 62 | Temp 98.2°F | Resp 20 | Ht 61.0 in | Wt 141.0 lb

## 2020-07-09 DIAGNOSIS — S80862D Insect bite (nonvenomous), left lower leg, subsequent encounter: Secondary | ICD-10-CM

## 2020-07-09 DIAGNOSIS — W57XXXD Bitten or stung by nonvenomous insect and other nonvenomous arthropods, subsequent encounter: Secondary | ICD-10-CM | POA: Diagnosis not present

## 2020-07-09 NOTE — Patient Instructions (Signed)

## 2020-07-09 NOTE — Progress Notes (Signed)
   Subjective:    Patient ID: Deanna Lawson, female    DOB: May 12, 1965, 55 y.o.   MRN: [id number]   Chief Complaint: Insect Bite (On left leg. Did Visit and rx'd doxy and prednisone. Still taking but wanted it seen in person/)   HPI Patient come sin c/o of possible spider bite on left lower leg. She did a video visit and was given doxycycline and prednisone. Seems better but redness has not gone down in size. Denies any itching, or pain. No drainage and is cool to touch.   Review of Systems  Constitutional: Negative for diaphoresis.  Eyes: Negative for pain.  Respiratory: Negative for shortness of breath.   Cardiovascular: Negative for chest pain, palpitations and leg swelling.  Gastrointestinal: Negative for abdominal pain.  Endocrine: Negative for polydipsia.  Skin: Negative for rash.  Neurological: Negative for dizziness, weakness and headaches.  Hematological: Does not bruise/bleed easily.  All other systems reviewed and are negative.      Objective:   Physical Exam Vitals and nursing note reviewed.  Constitutional:      Appearance: Normal appearance.  Skin:    Comments: erythematous macular patchy area on left lateral lowerr leg. No drainage, nontender, and cool to touch.  Neurological:     Mental Status: She is alert.    BP 112/67   Pulse 62   Temp 98.2 F (36.8 C) (Temporal)   Resp 20   Ht 5\' 1"  (1.549 m)   Wt 141 lb (64 kg)   SpO2 98%   BMI 26.64 kg/m         Assessment & Plan:  Deanna Lawson in today with chief complaint of Insect Bite (On left leg. Did Visit and rx'd doxy and prednisone. Still taking but wanted it seen in person/)   1. Insect bite of left lower leg, subsequent encounter Finish doxycycline as prescribed Finish prednisone as prescribed May take several weeks for the discoloration to go away. RTO Prn    The above assessment and management plan was discussed with the patient. The patient verbalized understanding of and has agreed  to the management plan. Patient is aware to call the clinic if symptoms persist or worsen. Patient is aware when to return to the clinic for a follow-up visit. Patient educated on when it is appropriate to go to the emergency department.   Mary-Margaret June, FNP

## 2020-07-18 ENCOUNTER — Encounter: Payer: Self-pay | Admitting: Family Medicine

## 2020-07-30 ENCOUNTER — Encounter: Payer: Self-pay | Admitting: Family Medicine

## 2021-04-18 ENCOUNTER — Encounter: Payer: Self-pay | Admitting: Family Medicine

## 2021-04-18 ENCOUNTER — Ambulatory Visit: Payer: BC Managed Care – PPO | Admitting: Family Medicine

## 2021-04-18 VITALS — BP 119/79 | HR 79 | Temp 98.1°F | Ht 61.0 in | Wt 140.0 lb

## 2021-04-18 DIAGNOSIS — Z23 Encounter for immunization: Secondary | ICD-10-CM

## 2021-04-18 DIAGNOSIS — L03011 Cellulitis of right finger: Secondary | ICD-10-CM | POA: Diagnosis not present

## 2021-04-18 MED ORDER — DOXYCYCLINE HYCLATE 100 MG PO TABS: 100.0000 mg | ORAL_TABLET | Freq: Two times a day (BID) | ORAL | 0 refills | Status: DC

## 2021-04-18 NOTE — Progress Notes (Addendum)
Subjective:  Patient ID: Deanna Lawson, female    DOB: 01-20-1966, 56 y.o.   MRN: [id number]  Patient Care Team: Deanna Lawson, Deanna M, FNP as PCP - General (Family Medicine)   Chief Complaint:  infected finger   HPI: Deanna Lawson is a 56 y.o. female presenting on 04/18/2021 for infected finger    Pt presents with a 3 day history of painful, swollen, throbbing, reddened right index finger. She accidentally stabbed herself with an earring. She denies drainage of the finger. Her Tdap was due in 2019.     Relevant past medical, surgical, family, and social history reviewed and updated as indicated.  Allergies and medications reviewed and updated. Data reviewed: Chart in Epic.   Past Medical History:  Diagnosis Date   Dermatitis    Knee pain    Left    History reviewed. No pertinent surgical history.  Social History   Socioeconomic History   Marital status: Married    Spouse name: Not on file   Number of children: Not on file   Years of education: Not on file   Highest education level: Not on file  Occupational History   Not on file  Tobacco Use   Smoking status: Never   Smokeless tobacco: Never  Vaping Use   Vaping Use: Never used  Substance and Sexual Activity   Alcohol use: No   Drug use: No   Sexual activity: Not on file  Other Topics Concern   Not on file  Social History Narrative   Not on file   Social Determinants of Health   Financial Resource Strain: Not on file  Food Insecurity: Not on file  Transportation Needs: Not on file  Physical Activity: Not on file  Stress: Not on file  Social Connections: Not on file  Intimate Partner Violence: Not on file    Outpatient Encounter Medications as of 04/18/2021  Medication Sig   doxycycline (VIBRA-TABS) 100 MG tablet Take 1 tablet (100 mg total) by mouth 2 (two) times daily for 7 days.   fexofenadine (ALLEGRA) 180 MG tablet Take 180 mg by mouth daily.   Multiple Vitamins-Minerals (DAILY MULTIVITAMIN PO)  Daily Multivitamin   [DISCONTINUED] cetirizine (ZYRTEC) 10 MG tablet Zyrtec 10 mg tablet   1 tablet every day by oral route.   [DISCONTINUED] doxycycline (VIBRA-TABS) 100 MG tablet Take 1 tablet (100 mg total) by mouth 2 (two) times daily.   [DISCONTINUED] fluticasone (FLONASE) 50 MCG/ACT nasal spray Place 2 sprays into both nostrils daily. (Patient not taking: Reported on 10/20/2019)   [DISCONTINUED] mupirocin ointment (BACTROBAN) 2 % Apply 1 application topically 2 (two) times daily.   [DISCONTINUED] ofloxacin (OCUFLOX) 0.3 % ophthalmic solution Place 1 drop into both eyes 4 (four) times daily. (Patient not taking: Reported on 10/20/2019)   [DISCONTINUED] predniSONE (STERAPRED UNI-PAK 21 TAB) 10 MG (21) TBPK tablet Use as directed   [DISCONTINUED] triamcinolone ointment (KENALOG) 0.1 % Apply 1 application topically 2 (two) times daily. During flare-up (Patient not taking: Reported on 10/20/2019)   No facility-administered encounter medications on file as of 04/18/2021.    Allergies  Allergen Reactions   Bee Venom Swelling   Sulfa Antibiotics Rash    Review of Systems  Constitutional:  Negative for activity change, appetite change, chills, diaphoresis, fatigue, fever and unexpected weight change.  HENT: Negative.    Eyes: Negative.   Respiratory:  Negative for cough, chest tightness and shortness of breath.   Cardiovascular:  Negative for chest pain,  palpitations and leg swelling.  Gastrointestinal:  Negative for blood in stool, constipation, diarrhea, nausea and vomiting.  Endocrine: Negative.   Genitourinary:  Negative for dysuria, frequency and urgency.  Musculoskeletal:  Negative for arthralgias and myalgias.  Skin:  Positive for color change and wound. Negative for pallor and rash.  Allergic/Immunologic: Negative.   Neurological:  Negative for dizziness, weakness and headaches.  Hematological: Negative.   Psychiatric/Behavioral:  Negative for confusion, hallucinations, sleep  disturbance and suicidal ideas.   All other systems reviewed and are negative.      Objective:  BP 119/79    Pulse 79    Temp 98.1 F (36.7 C) (Temporal)    Ht 5\' 1"  (1.549 Lawson)    Wt 63.5 kg    SpO2 97%    BMI 26.45 kg/Lawson    Wt Readings from Last 3 Encounters:  04/18/21 63.5 kg  07/09/20 64 kg  10/20/19 59.9 kg    Physical Exam Vitals and nursing note reviewed.  Constitutional:      Appearance: Normal appearance. She is normal weight.  HENT:     Head: Normocephalic and atraumatic.  Eyes:     Pupils: Pupils are equal, round, and reactive to light.  Cardiovascular:     Rate and Rhythm: Normal rate and regular rhythm.     Pulses: Normal pulses.     Heart sounds: Normal heart sounds.  Pulmonary:     Effort: Pulmonary effort is normal.     Breath sounds: Normal breath sounds.  Musculoskeletal:        General: Swelling (of the R index finger) present. Normal range of motion.  Skin:    General: Skin is warm and dry.     Capillary Refill: Capillary refill takes less than 2 seconds.     Findings: Erythema (R index finger) present.       Neurological:     General: No focal deficit present.     Mental Status: She is alert and oriented to person, place, and time. Mental status is at baseline.  Psychiatric:        Mood and Affect: Mood normal.        Behavior: Behavior normal.        Thought Content: Thought content normal.        Judgment: Judgment normal.    Results for orders placed or performed in visit on 02/04/19  Novel Coronavirus, NAA ([facility])   Specimen: Nasopharyngeal(NP) swabs in vial transport medium   NASOPHARYNGE  TESTING  Result Value Ref Range   SARS-CoV-2, NAA Not Detected Not Detected       Pertinent labs & imaging results that were available during my care of the patient were reviewed by me and considered in my medical decision making.  Assessment & Plan:  [name] was seen today for infected finger.  Diagnoses and all orders for this  visit:  Cellulitis of right index finger Need for tetanus booster -No sign of systemic illness, patient afebrile, denies chills. Will treat with doxycycline and warm soaks. Return in 1 week if not improved or worsening.  -     doxycycline (VIBRA-TABS) 100 MG tablet; Take 1 tablet (100 mg total) by mouth 2 (two) times daily for 7 days. -     Tdap vaccine greater than or equal to 7yo IM     Continue all other maintenance medications.  Follow up plan: Return in 1 week (on 04/25/2021), or if symptoms worsen or fail to improve.   Continue healthy  lifestyle choices, including diet (rich in fruits, vegetables, and lean proteins, and low in salt and simple carbohydrates) and exercise (at least 30 minutes of moderate physical activity daily).  Educational handout given for Cellulitis   The above assessment and management plan was discussed with the patient. The patient verbalized understanding of and has agreed to the management plan. Patient is aware to call the clinic if they develop any new symptoms or if symptoms persist or worsen. Patient is aware when to return to the clinic for a follow-up visit. Patient educated on when it is appropriate to go to the emergency department.    Bard Herbert, NP-S  I personally was present during the history, physical exam, and medical decision-making activities of this visit and have verified that the services and findings are accurately documented in the nurse practitioner student's note.  Kari Baars, FNP-C [facility] [address] [place], [place] [address] [phone]

## 2021-04-25 ENCOUNTER — Ambulatory Visit: Payer: BC Managed Care – PPO | Admitting: Family Medicine

## 2021-04-25 ENCOUNTER — Encounter: Payer: Self-pay | Admitting: Family Medicine

## 2021-04-25 VITALS — BP 110/65 | HR 69 | Temp 98.3°F | Ht 61.0 in | Wt 144.0 lb

## 2021-04-25 DIAGNOSIS — L03011 Cellulitis of right finger: Secondary | ICD-10-CM | POA: Diagnosis not present

## 2021-04-25 MED ORDER — DOXYCYCLINE HYCLATE 100 MG PO TABS: 100.0000 mg | ORAL_TABLET | Freq: Two times a day (BID) | ORAL | 0 refills | Status: AC

## 2021-04-25 NOTE — Progress Notes (Signed)
Subjective:  Patient ID: Deanna Lawson, female    DOB: January 02, 1966, 56 y.o.   MRN: [id number]  Patient Care Team: Sonny Masters, FNP as PCP - General (Family Medicine)   Chief Complaint:  Cellulitis   HPI: Deanna Lawson is a 56 y.o. female presenting on 04/25/2021 for Cellulitis   Pt presents today for wound recheck. She was here 04/18/2021 for finger swelling, erythema, and pain after receiving a puncture wound from an earring. She has been taking doxycycline as prescribed.   Wound Check She was originally treated 5 to 10 days ago. Previous treatment included oral antibiotics and wound cleansing or irrigation. There has been no drainage from the wound. The redness has improved. The swelling has improved. The pain has improved. She has no difficulty moving the affected extremity or digit.     Relevant past medical, surgical, family, and social history reviewed and updated as indicated.  Allergies and medications reviewed and updated. Data reviewed: Chart in Epic.   Past Medical History:  Diagnosis Date   Dermatitis    Knee pain    Left    History reviewed. No pertinent surgical history.  Social History   Socioeconomic History   Marital status: Married    Spouse name: Not on file   Number of children: Not on file   Years of education: Not on file   Highest education level: Not on file  Occupational History   Not on file  Tobacco Use   Smoking status: Never   Smokeless tobacco: Never  Vaping Use   Vaping Use: Never used  Substance and Sexual Activity   Alcohol use: No   Drug use: No   Sexual activity: Not on file  Other Topics Concern   Not on file  Social History Narrative   Not on file   Social Determinants of Health   Financial Resource Strain: Not on file  Food Insecurity: Not on file  Transportation Needs: Not on file  Physical Activity: Not on file  Stress: Not on file  Social Connections: Not on file  Intimate Partner Violence: Not on file     Outpatient Encounter Medications as of 04/25/2021  Medication Sig   doxycycline (VIBRA-TABS) 100 MG tablet Take 1 tablet (100 mg total) by mouth 2 (two) times daily for 3 days. 1 po bid   levocetirizine (XYZAL) 5 MG tablet Take 1 tablet by mouth every evening.   Multiple Vitamins-Minerals (DAILY MULTIVITAMIN PO) Daily Multivitamin   [DISCONTINUED] doxycycline (VIBRA-TABS) 100 MG tablet Take 1 tablet (100 mg total) by mouth 2 (two) times daily for 7 days.   [DISCONTINUED] fexofenadine (ALLEGRA) 180 MG tablet Take 180 mg by mouth daily.   No facility-administered encounter medications on file as of 04/25/2021.    Allergies  Allergen Reactions   Bee Venom Swelling   Sulfa Antibiotics Rash    Review of Systems  Constitutional:  Negative for activity change, appetite change, chills, diaphoresis, fatigue, fever and unexpected weight change.  HENT: Negative.    Eyes: Negative.   Respiratory:  Negative for cough, chest tightness and shortness of breath.   Cardiovascular:  Negative for chest pain, palpitations and leg swelling.  Gastrointestinal:  Negative for abdominal pain, blood in stool, constipation, diarrhea, nausea and vomiting.  Endocrine: Negative.   Genitourinary:  Negative for dysuria, frequency and urgency.  Musculoskeletal:  Negative for arthralgias and myalgias.  Skin:  Positive for color change and wound. Negative for pallor and rash.  Allergic/Immunologic:  Negative.   Neurological:  Negative for dizziness, weakness and headaches.  Hematological: Negative.   Psychiatric/Behavioral:  Negative for confusion, hallucinations, sleep disturbance and suicidal ideas.   All other systems reviewed and are negative.      Objective:  BP 110/65    Pulse 69    Temp 98.3 F (36.8 C) (Temporal)    Ht 5\' 1"  (1.549 m)    Wt 144 lb (65.3 kg)    BMI 27.21 kg/m    Wt Readings from Last 3 Encounters:  04/25/21 144 lb (65.3 kg)  04/18/21 140 lb (63.5 kg)  07/09/20 141 lb (64 kg)     Physical Exam Vitals and nursing note reviewed.  Constitutional:      General: She is not in acute distress.    Appearance: Normal appearance. She is not ill-appearing, toxic-appearing or diaphoretic.  HENT:     Head: Normocephalic and atraumatic.  Eyes:     Pupils: Pupils are equal, round, and reactive to light.  Cardiovascular:     Rate and Rhythm: Normal rate and regular rhythm.     Heart sounds: Normal heart sounds. No murmur heard.   No friction rub. No gallop.  Pulmonary:     Effort: Pulmonary effort is normal.     Breath sounds: Normal breath sounds.  Skin:    General: Skin is warm and dry.     Capillary Refill: Capillary refill takes less than 2 seconds.     Findings: Erythema and wound present.     Comments: Redness and swelling to finger greatly improved.   Neurological:     General: No focal deficit present.     Mental Status: She is alert and oriented to person, place, and time.  Psychiatric:        Mood and Affect: Mood normal.        Behavior: Behavior normal.        Thought Content: Thought content normal.        Judgment: Judgment normal.    Results for orders placed or performed in visit on 02/04/19  Novel Coronavirus, NAA ([facility])   Specimen: Nasopharyngeal(NP) swabs in vial transport medium   NASOPHARYNGE  TESTING  Result Value Ref Range   SARS-CoV-2, NAA Not Detected Not Detected       Pertinent labs & imaging results that were available during my care of the patient were reviewed by me and considered in my medical decision making.  Assessment & Plan:  [name] was seen today for cellulitis.  Diagnoses and all orders for this visit:  Cellulitis of right index finger Greatly improved but still slightly red and swollen. Will extend doxycycline for 3 more days. Symptomatic care discussed in detail. Report any new, worsening, or persistent symptoms.  -     doxycycline (VIBRA-TABS) 100 MG tablet; Take 1 tablet (100 mg total) by mouth 2 (two) times  daily for 3 days. 1 po bid     Continue all other maintenance medications.  Follow up plan: Return if symptoms worsen or fail to improve.   Continue healthy lifestyle choices, including diet (rich in fruits, vegetables, and lean proteins, and low in salt and simple carbohydrates) and exercise (at least 30 minutes of moderate physical activity daily).   The above assessment and management plan was discussed with the patient. The patient verbalized understanding of and has agreed to the management plan. Patient is aware to call the clinic if they develop any new symptoms or if symptoms persist or worsen. Patient  is aware when to return to the clinic for a follow-up visit. Patient educated on when it is appropriate to go to the emergency department.   Monia Pouch, FNP-C [facility] [phone]

## 2021-07-02 DIAGNOSIS — M8580 Other specified disorders of bone density and structure, unspecified site: Secondary | ICD-10-CM | POA: Insufficient documentation

## 2021-07-02 DIAGNOSIS — R351 Nocturia: Secondary | ICD-10-CM | POA: Insufficient documentation

## 2021-07-15 ENCOUNTER — Encounter: Payer: Self-pay | Admitting: Family Medicine

## 2021-07-15 ENCOUNTER — Ambulatory Visit: Payer: BC Managed Care – PPO | Admitting: Family Medicine

## 2021-07-15 DIAGNOSIS — Z20818 Contact with and (suspected) exposure to other bacterial communicable diseases: Secondary | ICD-10-CM | POA: Diagnosis not present

## 2021-07-15 DIAGNOSIS — J029 Acute pharyngitis, unspecified: Secondary | ICD-10-CM

## 2021-07-15 LAB — RAPID STREP SCREEN (MED CTR MEBANE ONLY): Strep Gp A Ag, IA W/Reflex: NEGATIVE

## 2021-07-15 LAB — CULTURE, GROUP A STREP

## 2021-07-15 MED ORDER — AMOXICILLIN 500 MG PO CAPS: 500.0000 mg | ORAL_CAPSULE | Freq: Two times a day (BID) | ORAL | 0 refills | Status: AC

## 2021-07-15 NOTE — Progress Notes (Signed)
? ?  Virtual Visit  Note ?Due to COVID-19 pandemic this visit was conducted virtually. This visit type was conducted due to national recommendations for restrictions regarding the COVID-19 Pandemic (e.g. social distancing, sheltering in place) in an effort to limit this patient's exposure and mitigate transmission in our community. All issues noted in this document were discussed and addressed.  A physical exam was not performed with this format. ? ?I connected with Deanna Lawson on 07/15/21 at 1108 by telephone and verified that I am speaking with the correct person using two identifiers. CHAMEKA MCMULLEN is currently located at work and no one is currently with her during the visit. The provider, Gabriel Earing, FNP is located in their office at time of visit. ? ?I discussed the limitations, risks, security and privacy concerns of performing an evaluation and management service by telephone and the availability of in person appointments. I also discussed with the patient that there may be a patient responsible charge related to this service. The patient expressed understanding and agreed to proceed. ? ?CC: sore throat ? ?History and Present Illness: ? ?HPI ?Ai reports a sore throat that has been intermittent for the last 2 days. It is primarily on the left side. She also reports nausea x 1 day. Her husband, daughter, and daughter's boyfriend have all tested positive for strep. She denies fever, HA, body aches, chills, cough, or congestion. She will come by the office this afternoon for a strep test.  ? ? ? ?ROS ?As per HPI.  ? ?Observations/Objective: ?Alert and oriented x 3. Able to speak in full sentences without difficulty.  ? ?Assessment and Plan: ?Arrion was seen today for sore throat. ? ?Diagnoses and all orders for this visit: ? ?Sore throat ?Exposure to strep throat ?Rapid strep is negative and culture pending. Discussed will send in abx for treatment however given symptomatic and multiple close  exposures. Tylenol for pain, fever. Cool or warm fluids. Stay well hydrated.  ?-     Rapid Strep Screen ([facility] ONLY); Future ?-     amoxicillin (AMOXIL) 500 MG capsule; Take 1 capsule (500 mg total) by mouth 2 (two) times daily for 10 days. ? ? ?Follow Up Instructions: ?Return to office for new or worsening symptoms, or if symptoms persist.  ? ?  ?I discussed the assessment and treatment plan with the patient. The patient was provided an opportunity to ask questions and all were answered. The patient agreed with the plan and demonstrated an understanding of the instructions. ?  ?The patient was advised to call back or seek an in-person evaluation if the symptoms worsen or if the condition fails to improve as anticipated. ? ?The above assessment and management plan was discussed with the patient. The patient verbalized understanding of and has agreed to the management plan. Patient is aware to call the clinic if symptoms persist or worsen. Patient is aware when to return to the clinic for a follow-up visit. Patient educated on when it is appropriate to go to the emergency department.  ? ?Time call ended:  1120 ? ?I provided 20 minutes of  non face-to-face time during this encounter. ? ? ? ?Gabriel Earing, FNP ? ? ?

## 2021-07-15 NOTE — Addendum Note (Signed)
Addended by: Tamera Punt on: 07/15/2021 03:57 PM ? ? Modules accepted: Orders ? ?

## 2021-07-18 LAB — CULTURE, GROUP A STREP: Strep A Culture: NEGATIVE

## 2022-06-02 ENCOUNTER — Encounter: Payer: Self-pay | Admitting: Internal Medicine

## 2022-06-09 ENCOUNTER — Encounter: Payer: Self-pay | Admitting: Family Medicine

## 2022-06-09 ENCOUNTER — Ambulatory Visit: Payer: BC Managed Care – PPO | Admitting: Family Medicine

## 2022-06-09 VITALS — BP 117/70 | HR 81 | Temp 98.0°F | Ht 61.0 in | Wt 140.5 lb

## 2022-06-09 DIAGNOSIS — J0100 Acute maxillary sinusitis, unspecified: Secondary | ICD-10-CM

## 2022-06-09 MED ORDER — AMOXICILLIN-POT CLAVULANATE 875-125 MG PO TABS: 1.0000 | ORAL_TABLET | Freq: Two times a day (BID) | ORAL | 0 refills | Status: AC

## 2022-06-09 MED ORDER — PREDNISONE 10 MG (21) PO TBPK: ORAL_TABLET | ORAL | 0 refills | Status: DC

## 2022-06-09 NOTE — Progress Notes (Signed)
Acute Office Visit  Subjective:     Patient ID: Deanna Lawson, female    DOB: March 19, 1965, 57 y.o.   MRN: [id number]  Chief Complaint  Patient presents with   Sinusitis    Sinusitis This is a new problem. Episode onset: 10 days. The problem has been gradually worsening since onset. There has been no fever. Associated symptoms include congestion, ear pain, headaches and sinus pressure. Pertinent negatives include no chills, coughing, hoarse voice, shortness of breath, sneezing, sore throat or swollen glands. Past treatments include oral decongestants and saline nose sprays (flonase, allegra-D). The treatment provided no relief.   History of chronic allergic rhinitis. She is getting allergy shots every 2 weeks.   Review of Systems  Constitutional:  Negative for chills.  HENT:  Positive for congestion, ear pain and sinus pressure. Negative for hoarse voice, sneezing and sore throat.   Respiratory:  Negative for cough and shortness of breath.   Neurological:  Positive for headaches.        Objective:    BP 117/70   Pulse 81   Temp 98 F (36.7 C) (Temporal)   Ht 5\' 1"  (1.549 m)   Wt 140 lb 8 oz (63.7 kg)   SpO2 98%   BMI 26.55 kg/m    Physical Exam Vitals and nursing note reviewed.  Constitutional:      General: She is not in acute distress.    Appearance: She is not ill-appearing, toxic-appearing or diaphoretic.  HENT:     Right Ear: Ear canal and external ear normal. A middle ear effusion is present. Tympanic membrane is not perforated, erythematous, retracted or bulging.     Left Ear: Ear canal and external ear normal. A middle ear effusion is present. Tympanic membrane is not perforated, erythematous, retracted or bulging.     Nose: Congestion present.     Right Sinus: Maxillary sinus tenderness present.     Left Sinus: Maxillary sinus tenderness present.     Mouth/Throat:     Mouth: Mucous membranes are moist.     Pharynx: Oropharynx is clear. No oropharyngeal  exudate or posterior oropharyngeal erythema.  Eyes:     General:        Right eye: No discharge.        Left eye: No discharge.     Conjunctiva/sclera: Conjunctivae normal.  Cardiovascular:     Rate and Rhythm: Normal rate and regular rhythm.     Heart sounds: Normal heart sounds. No murmur heard. Pulmonary:     Effort: Pulmonary effort is normal. No respiratory distress.     Breath sounds: Normal breath sounds. No wheezing.  Musculoskeletal:     Cervical back: Neck supple. No rigidity.  Lymphadenopathy:     Cervical: No cervical adenopathy.  Skin:    General: Skin is warm and dry.  Neurological:     General: No focal deficit present.     Mental Status: She is alert and oriented to person, place, and time.  Psychiatric:        Mood and Affect: Mood normal.        Behavior: Behavior normal.     No results found for any visits on 06/09/22.      Assessment & Plan:   Deanna Lawson was seen today for sinusitis.  Diagnoses and all orders for this visit:  Acute non-recurrent maxillary sinusitis Augmentin and prednisone taper as below. Discussed symptomatic care and return precautions.  -     amoxicillin-clavulanate (AUGMENTIN) 875-125 MG  tablet; Take 1 tablet by mouth 2 (two) times daily for 7 days. -     predniSONE (STERAPRED UNI-PAK 21 TAB) 10 MG (21) TBPK tablet; Use as directed on back of pill pack  Return to office for new or worsening symptoms, or if symptoms persist.   The patient indicates understanding of these issues and agrees with the plan.  Gwenlyn Perking, FNP

## 2022-06-10 DIAGNOSIS — L918 Other hypertrophic disorders of the skin: Secondary | ICD-10-CM | POA: Insufficient documentation

## 2022-06-10 DIAGNOSIS — L700 Acne vulgaris: Secondary | ICD-10-CM | POA: Insufficient documentation

## 2022-06-24 ENCOUNTER — Ambulatory Visit (AMBULATORY_SURGERY_CENTER): Payer: Self-pay

## 2022-06-24 VITALS — Ht 61.0 in | Wt 140.0 lb

## 2022-06-24 DIAGNOSIS — Z1211 Encounter for screening for malignant neoplasm of colon: Secondary | ICD-10-CM

## 2022-06-24 MED ORDER — NA SULFATE-K SULFATE-MG SULF 17.5-3.13-1.6 GM/177ML PO SOLN
1.0000 | Freq: Once | ORAL | 0 refills | Status: AC
Start: 2022-06-24 — End: 2022-06-24

## 2022-06-24 NOTE — Progress Notes (Signed)
No egg or soy allergy known to patient  No issues known to pt with past sedation with any surgeries or procedures Patient denies ever being told they had issues or difficulty with intubation  No FH of Malignant Hyperthermia Pt is not on diet pills Pt is not on  home 02  Pt is not on blood thinners  Pt denies issues with constipation  No A fib or A flutter Have any cardiac testing pending--noi Pt instructed to use Single[employer]com or GoodRx for a price reduction on prep   

## 2022-06-25 ENCOUNTER — Encounter: Payer: Self-pay | Admitting: Internal Medicine

## 2022-07-01 ENCOUNTER — Encounter: Payer: BC Managed Care – PPO | Admitting: Internal Medicine

## 2022-07-30 ENCOUNTER — Encounter: Payer: BC Managed Care – PPO | Admitting: Internal Medicine

## 2022-08-05 ENCOUNTER — Encounter: Payer: BC Managed Care – PPO | Admitting: Internal Medicine

## 2022-09-16 ENCOUNTER — Ambulatory Visit: Payer: BC Managed Care – PPO | Admitting: Internal Medicine

## 2022-09-16 ENCOUNTER — Encounter: Payer: Self-pay | Admitting: Internal Medicine

## 2022-09-16 VITALS — BP 127/71 | HR 70 | Temp 98.6°F | Resp 11 | Ht 61.0 in | Wt 140.0 lb

## 2022-09-16 DIAGNOSIS — Z1211 Encounter for screening for malignant neoplasm of colon: Secondary | ICD-10-CM | POA: Diagnosis not present

## 2022-09-16 NOTE — Progress Notes (Signed)
Pt's states no medical or surgical changes since previsit or office visit. 

## 2022-09-16 NOTE — Progress Notes (Signed)
GASTROENTEROLOGY PROCEDURE H&P NOTE   Primary Care Physician: Richardean Chimera, MD    Reason for Procedure:   Colon cancer screening  Plan:    Colonoscopy  Patient is appropriate for endoscopic procedure(s) in the ambulatory ([facility]) setting.  The nature of the procedure, as well as the risks, benefits, and alternatives were carefully and thoroughly reviewed with the patient. Ample time for discussion and questions allowed. The patient understood, was satisfied, and agreed to proceed.     HPI: Deanna Lawson is a 57 y.o. female who presents for colonoscopy for colon cancer screening. Denies blood in stools, changes in bowel habits, or unintentional weight loss. Grandfather and father had colon polyps. Grandfather had to have a portion of his colon removed for polyps.  Past Medical History:  Diagnosis Date   Allergy    Dermatitis    Knee pain    Left    History reviewed. No pertinent surgical history.  Prior to Admission medications   Medication Sig Start Date End Date Taking? Authorizing Provider  levocetirizine (XYZAL) 5 MG tablet Take 1 tablet by mouth every evening.   Yes [provider]  Multiple Vitamins-Minerals (DAILY MULTIVITAMIN PO) Daily Multivitamin   Yes [provider]  clotrimazole-betamethasone (LOTRISONE) cream Apply 1 Application topically 2 (two) times daily.    [provider]  EPINEPHrine (EPIPEN 2-PAK) 0.3 mg/0.3 mL IJ SOAJ injection as directed Injection prn for 30 days    [provider]  fluticasone (FLONASE) 50 MCG/ACT nasal spray Place 1 spray into both nostrils daily.    [provider]  Olopatadine HCl (PATADAY) 0.2 % SOLN 1 drop into affected eye Ophthalmic Once a day    [provider]  tacrolimus (PROTOPIC) 0.03 % ointment 1 application Externally twice a day as needed to eye lids    [provider]    Current Outpatient Medications  Medication Sig Dispense Refill   levocetirizine  (XYZAL) 5 MG tablet Take 1 tablet by mouth every evening.     Multiple Vitamins-Minerals (DAILY MULTIVITAMIN PO) Daily Multivitamin     clotrimazole-betamethasone (LOTRISONE) cream Apply 1 Application topically 2 (two) times daily.     EPINEPHrine (EPIPEN 2-PAK) 0.3 mg/0.3 mL IJ SOAJ injection as directed Injection prn for 30 days     fluticasone (FLONASE) 50 MCG/ACT nasal spray Place 1 spray into both nostrils daily.     Olopatadine HCl (PATADAY) 0.2 % SOLN 1 drop into affected eye Ophthalmic Once a day     tacrolimus (PROTOPIC) 0.03 % ointment 1 application Externally twice a day as needed to eye lids     No current facility-administered medications for this visit.    Allergies as of 09/16/2022 - Review Complete 09/16/2022  Allergen Reaction Noted   Augmentin [amoxicillin-pot clavulanate] Nausea Only 06/24/2022   Bee venom Swelling 10/14/2012   Mixed feathers Hives and Itching 06/24/2022   Cat hair extract Hives and Rash 06/24/2022   Dog epithelium Itching and Rash 06/24/2022   Mixed grasses Itching and Rash 06/24/2022   Molds & smuts Itching and Rash 06/24/2022   Sulfa antibiotics Rash 10/14/2012    Family History  Problem Relation Age of Onset   Asthma Mother    Hypertension Mother    Osteoporosis Father    Diabetes Father    Diabetes Sister    Colon cancer Paternal Grandfather    Colon polyps Neg Hx    Esophageal cancer Neg Hx    Rectal cancer Neg Hx  Stomach cancer Neg Hx     Social History   Socioeconomic History   Marital status: Married    Spouse name: Not on file   Number of children: Not on file   Years of education: Not on file   Highest education level: Not on file  Occupational History   Not on file  Tobacco Use   Smoking status: Never   Smokeless tobacco: Never  Vaping Use   Vaping Use: Never used  Substance and Sexual Activity   Alcohol use: No   Drug use: No   Sexual activity: Not on file  Other Topics Concern   Not on file  Social History  Narrative   Not on file   Social Determinants of Health   Financial Resource Strain: Not on file  Food Insecurity: Not on file  Transportation Needs: Not on file  Physical Activity: Not on file  Stress: Not on file  Social Connections: Not on file  Intimate Partner Violence: Not on file    Physical Exam: Vital signs in last 24 hours: BP 128/79   Pulse 68   Temp 98.6 F (37 C)   Ht 5\' 1"  (1.549 m)   Wt 140 lb (63.5 kg)   SpO2 100%   BMI 26.45 kg/m  GEN: NAD EYE: Sclerae anicteric ENT: MMM CV: Non-tachycardic Pulm: No increased work of breathing GI: Soft, NT/ND NEURO:  Alert & Oriented   Eulah Pont, MD [facility]  09/16/2022 8:30 AM

## 2022-09-16 NOTE — Progress Notes (Signed)
To pacu, VSS. Report to [email or web address].tb 

## 2022-09-16 NOTE — Op Note (Addendum)
[facility] Patient Name: Deanna Lawson Procedure Date: 09/16/2022 8:30 AM MRN: [id number] Endoscopist: Madelyn Brunner [place] , , [id number] Age: 57 Referring MD:  Date of Birth: 1965-04-10 Gender: Female Account #: [id number] Procedure:                Colonoscopy Indications:              Screening for colorectal malignant neoplasm, This                            is the patient's first colonoscopy Medicines:                Monitored Anesthesia Care Procedure:                Pre-Anesthesia Assessment:                           - Prior to the procedure, a History and Physical                            was performed, and patient medications and                            allergies were reviewed. The patient's tolerance of                            previous anesthesia was also reviewed. The risks                            and benefits of the procedure and the sedation                            options and risks were discussed with the patient.                            All questions were answered, and informed consent                            was obtained. Prior Anticoagulants: The patient has                            taken no anticoagulant or antiplatelet agents. ASA                            Grade Assessment: II - A patient with mild systemic                            disease. After reviewing the risks and benefits,                            the patient was deemed in satisfactory condition to                            undergo the procedure.  After obtaining informed consent, the colonoscope                            was passed under direct vision. Throughout the                            procedure, the patient's blood pressure, pulse, and                            oxygen saturations were monitored continuously. The                            Olympus Scope [id number] was introduced through the                            anus and advanced  to the the terminal ileum. The                            colonoscopy was performed without difficulty. The                            patient tolerated the procedure well. The quality                            of the bowel preparation was excellent. The                            terminal ileum, ileocecal valve, appendiceal                            orifice, and rectum were photographed. Scope In: 8:47:32 AM Scope Out: 9:01:28 AM Scope Withdrawal Time: 0 hours 9 minutes 57 seconds  Total Procedure Duration: 0 hours 13 minutes 56 seconds  Findings:                 The terminal ileum appeared normal.                           Non-bleeding internal hemorrhoids were found during                            retroflexion.                           The exam was otherwise without abnormality. Complications:            No immediate complications. Estimated Blood Loss:     Estimated blood loss: none. Impression:               - The examined portion of the ileum was normal.                           - Non-bleeding internal hemorrhoids.                           - The examination was otherwise normal.                           -  No specimens collected. Recommendation:           - Discharge patient to home (with escort).                           - Repeat colonoscopy in 10 years for screening                            purposes.                           - The findings and recommendations were discussed                            with the patient. Dr Deanna Lawson "Deanna Lawson" Deanna Lawson,  09/16/2022 9:03:40 AM

## 2022-09-16 NOTE — Patient Instructions (Signed)
Please read handouts provided. Repeat colonoscopy in 10 years for screening.   YOU HAD AN ENDOSCOPIC PROCEDURE TODAY AT THE [facility]:   Refer to the procedure report that was given to you for any specific questions about what was found during the examination.  If the procedure report does not answer your questions, please call your gastroenterologist to clarify.  If you requested that your care partner not be given the details of your procedure findings, then the procedure report has been included in a sealed envelope for you to review at your convenience later.  YOU SHOULD EXPECT: Some feelings of bloating in the abdomen. Passage of more gas than usual.  Walking can help get rid of the air that was put into your GI tract during the procedure and reduce the bloating. If you had a lower endoscopy (such as a colonoscopy or flexible sigmoidoscopy) you may notice spotting of blood in your stool or on the toilet paper. If you underwent a bowel prep for your procedure, you may not have a normal bowel movement for a few days.  Please Note:  You might notice some irritation and congestion in your nose or some drainage.  This is from the oxygen used during your procedure.  There is no need for concern and it should clear up in a day or so.  SYMPTOMS TO REPORT IMMEDIATELY:  Following lower endoscopy (colonoscopy or flexible sigmoidoscopy):  Excessive amounts of blood in the stool  Significant tenderness or worsening of abdominal pains  Swelling of the abdomen that is new, acute  Fever of 100F or higher   For urgent or emergent issues, a gastroenterologist can be reached at any hour by calling [phone]. Do not use MyChart messaging for urgent concerns.    DIET:  We do recommend a small meal at first, but then you may proceed to your regular diet.  Drink plenty of fluids but you should avoid alcoholic beverages for 24 hours.  ACTIVITY:  You should plan to take it easy for the rest  of today and you should NOT DRIVE or use heavy machinery until tomorrow (because of the sedation medicines used during the test).    FOLLOW UP: Our staff will call the number listed on your records the next business day following your procedure.  We will call around 7:15- 8:00 am to check on you and address any questions or concerns that you may have regarding the information given to you following your procedure. If we do not reach you, we will leave a message.     If any biopsies were taken you will be contacted by phone or by letter within the next 1-3 weeks.  Please call us at [phone] if you have not heard about the biopsies in 3 weeks.    SIGNATURES/CONFIDENTIALITY: You and/or your care partner have signed paperwork which will be entered into your electronic medical record.  These signatures attest to the fact that that the information above on your After Visit Summary has been reviewed and is understood.  Full responsibility of the confidentiality of this discharge information lies with you and/or your care-partner. 

## 2022-09-17 ENCOUNTER — Telehealth: Payer: Self-pay

## 2022-09-17 NOTE — Telephone Encounter (Signed)
Post procedure follow up call, no answer 

## 2023-07-16 ENCOUNTER — Ambulatory Visit: Payer: Self-pay

## 2023-07-16 ENCOUNTER — Ambulatory Visit: Admitting: Family Medicine

## 2023-07-16 ENCOUNTER — Encounter: Payer: Self-pay | Admitting: Family Medicine

## 2023-07-16 VITALS — BP 133/84 | HR 74 | Temp 97.5°F | Ht 61.0 in | Wt 145.6 lb

## 2023-07-16 DIAGNOSIS — S80262A Insect bite (nonvenomous), left knee, initial encounter: Secondary | ICD-10-CM

## 2023-07-16 DIAGNOSIS — W57XXXA Bitten or stung by nonvenomous insect and other nonvenomous arthropods, initial encounter: Secondary | ICD-10-CM | POA: Diagnosis not present

## 2023-07-16 DIAGNOSIS — L03116 Cellulitis of left lower limb: Secondary | ICD-10-CM

## 2023-07-16 MED ORDER — CEPHALEXIN 500 MG PO CAPS
500.0000 mg | ORAL_CAPSULE | Freq: Four times a day (QID) | ORAL | 0 refills | Status: AC
Start: 2023-07-16 — End: 2023-07-23

## 2023-07-16 NOTE — Progress Notes (Signed)
 Subjective:  Patient ID: Deanna Lawson, female    DOB: Aug 07, 1965, 59 y.o.   MRN: [id number]  Patient Care Team: Merryl Abraham, MD as PCP - General (Obstetrics and Gynecology)   Chief Complaint:  Insect Bite (Patient states she got bite by an insect Saturday . )   HPI: Deanna Lawson is a 58 y.o. female presenting on 07/16/2023 for Insect Bite (Patient states she got bite by an insect Saturday . )   History of Present Illness   Deanna Lawson is a 58 year old female who presents with a red, hard, and swollen insect bite.  [place] she is unsure of the exact insect responsible. The bite site developed a scab without any scratching, and she describes the area as 'real red' with a 'knot under it'.  No systemic symptoms such as fever, chills, or sweats are present. There is no drainage from the site, although she observed some dried material on it when removing her pants.  She has a history of sensitive skin and has experienced similar reactions to insect bites in the past, including a large spider bite. This is the third time she has sought medical attention for such reactions.  She is allergic to amoxicillin , which causes severe stomach upset, but can tolerate amoxicillin  alone. She is currently prescribed Keflex  500 mg four times a day for seven days, to be taken with food to prevent stomach issues.          Relevant past medical, surgical, family, and social history reviewed and updated as indicated.  Allergies and medications reviewed and updated. Data reviewed: Chart in Epic.   Past Medical History:  Diagnosis Date   Allergy    Dermatitis    Knee pain    Left    History reviewed. No pertinent surgical history.  Social History   Socioeconomic History   Marital status: Married    Spouse name: Not on file   Number of children: Not on file   Years of education: Not on file   Highest education level: Not on file   Occupational History   Not on file  Tobacco Use   Smoking status: Never   Smokeless tobacco: Never  Vaping Use   Vaping status: Never Used  Substance and Sexual Activity   Alcohol use: No   Drug use: No   Sexual activity: Not on file  Other Topics Concern   Not on file  Social History Narrative   Not on file   Social Drivers of Health   Financial Resource Strain: Not on file  Food Insecurity: Not on file  Transportation Needs: Not on file  Physical Activity: Not on file  Stress: Not on file  Social Connections: Not on file  Intimate Partner Violence: Not on file    Outpatient Encounter Medications as of 07/16/2023  Medication Sig   celecoxib (CELEBREX) 200 MG capsule Take 200 mg by mouth 2 (two) times daily.   cephALEXin  (KEFLEX ) 500 MG capsule Take 1 capsule (500 mg total) by mouth 4 (four) times daily for 7 days.   clotrimazole-betamethasone  (LOTRISONE) cream Apply 1 Application topically 2 (two) times daily.   EPINEPHrine (EPIPEN 2-PAK) 0.3 mg/0.3 mL IJ SOAJ injection as directed Injection prn for 30 days   fluticasone (FLONASE) 50 MCG/ACT nasal spray Place 1 spray into both nostrils daily.   levocetirizine (XYZAL) 5 MG tablet Take 1 tablet by mouth every evening.  Multiple Vitamins-Minerals (DAILY MULTIVITAMIN PO) Daily Multivitamin   Olopatadine HCl (PATADAY) 0.2 % SOLN 1 drop into affected eye Ophthalmic Once a day   tacrolimus (PROTOPIC) 0.03 % ointment 1 application Externally twice a day as needed to eye lids   No facility-administered encounter medications on file as of 07/16/2023.    Allergies  Allergen Reactions   Augmentin  [Amoxicillin -Pot Clavulanate] Nausea Only   Bee Venom Swelling   Mixed Feathers Hives and Itching   Cat Dander Hives and Rash   Dog Epithelium Itching and Rash   Mixed Grasses Itching and Rash   Molds & Smuts Itching and Rash   Sulfa Antibiotics Rash    Pertinent ROS per HPI, otherwise unremarkable      Objective:  BP 133/84    Pulse 74   Temp (!) 97.5 F (36.4 C)   Ht 5\' 1"  (1.549 m)   Wt 145 lb 9.6 oz (66 kg)   SpO2 99%   BMI 27.51 kg/m    Wt Readings from Last 3 Encounters:  07/16/23 145 lb 9.6 oz (66 kg)  09/16/22 140 lb (63.5 kg)  06/24/22 140 lb (63.5 kg)    Physical Exam Vitals and nursing note reviewed.  Constitutional:      General: She is not in acute distress.    Appearance: Normal appearance. She is not toxic-appearing or diaphoretic.  HENT:     Head: Normocephalic and atraumatic.     Nose: Nose normal.     Mouth/Throat:     Mouth: Mucous membranes are moist.  Eyes:     Conjunctiva/sclera: Conjunctivae normal.     Pupils: Pupils are equal, round, and reactive to light.  Cardiovascular:     Rate and Rhythm: Normal rate and regular rhythm.     Heart sounds: Normal heart sounds.  Pulmonary:     Effort: Pulmonary effort is normal.     Breath sounds: Normal breath sounds.  Skin:    General: Skin is warm and dry.     Capillary Refill: Capillary refill takes less than 2 seconds.     Findings: Wound present.       Neurological:     General: No focal deficit present.     Mental Status: She is alert and oriented to person, place, and time.  Psychiatric:        Mood and Affect: Mood normal.        Behavior: Behavior normal.        Thought Content: Thought content normal.        Judgment: Judgment normal.     Results for orders placed or performed in visit on 07/15/21  Rapid Strep Screen ([facility] ONLY)   Collection Time: 07/15/21  3:13 PM   Specimen: Other   Other  Result Value Ref Range   Strep Gp A Ag, IA W/Reflex Negative Negative  Culture, Group A Strep   Collection Time: 07/15/21  3:13 PM   Other  Result Value Ref Range   Strep A Culture CANCELED   Culture, Group A Strep   Collection Time: 07/15/21  3:57 PM   Specimen: Throat   TH  Result Value Ref Range   Strep A Culture Negative        Pertinent labs & imaging results that were available during my care of  the patient were reviewed by me and considered in my medical decision making.  Assessment & Plan:  Amelya was seen today for insect bite.  Diagnoses and all orders  for this visit:  Insect bite of left knee, initial encounter -     cephALEXin  (KEFLEX ) 500 MG capsule; Take 1 capsule (500 mg total) by mouth 4 (four) times daily for 7 days.  Cellulitis of left lower extremity -     cephALEXin  (KEFLEX ) 500 MG capsule; Take 1 capsule (500 mg total) by mouth 4 (four) times daily for 7 days.     Assessment and Plan    Cellulitis due to insect bite Cellulitis at the site of an insect bite, likely from an ant, with significant erythema and induration. No systemic symptoms such as fever or chills. The condition has persisted since Saturday, necessitating antibiotic treatment. Sensitive skin and previous reactions to insect bites. Allergic to Augmentin  due to gastrointestinal upset, but no known allergy to Keflex . Keflex  is chosen as it is not in the penicillin family and should be well tolerated. - Prescribe Keflex  500 mg four times a day for seven days. - Advise taking Keflex  with food to minimize gastrointestinal upset. - Instruct to clean the affected area with soap and water twice a day. - Advise to monitor for worsening symptoms or new symptoms and report if she occurs. - Send prescription to the pharmacy.          Continue all other maintenance medications.  Follow up plan: Return if symptoms worsen or fail to improve.   Continue healthy lifestyle choices, including diet (rich in fruits, vegetables, and lean proteins, and low in salt and simple carbohydrates) and exercise (at least 30 minutes of moderate physical activity daily).  Educational handout given for insect bite  The above assessment and management plan was discussed with the patient. The patient verbalized understanding of and has agreed to the management plan. Patient is aware to call the clinic if they develop any new  symptoms or if symptoms persist or worsen. Patient is aware when to return to the clinic for a follow-up visit. Patient educated on when it is appropriate to go to the emergency department.   Kattie Parrot, FNP-C [facility] [phone]

## 2023-07-16 NOTE — Telephone Encounter (Signed)
  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 Patient agreeable to see NP Rakes this afternoon.  Copied from CRM [id number]. Topic: Clinical - Red Word Triage >> Jul 16, 2023  8:30 AM Retta Caster wrote: Red Word that prompted transfer to Nurse Triage: Patient 04/26 Spider bite Bright red and inside dark purple crusted feels worse since she ahs been bit Reason for Disposition  [1] Red or very tender (to touch) area AND [2] started over 24 hours after the bite  Answer Assessment - Initial Assessment Questions 1. TYPE of INSECT: "What type of insect was it?"      Patient thought it was an ant at first but now thinks it may have been a spider.  2. ONSET: "When did you get bitten?"      Saturday 07/11/23.  3. LOCATION: "Where is the insect bite located?"      Left knee lateral.  4. REDNESS: "Is the area red or pink?" If Yes, ask: "What size is area of redness?" (inches or cm). "When did the redness start?"     Darker red/purple at the center and red around (about the size of a dime); started Monday night. She states it looks like this a scab.  5. PAIN: "Is there any pain?" If Yes, ask: "How bad is it?"  (Scale 1-10; or mild, moderate, severe)     Denies.  6. ITCHING: "Does it itch?" If Yes, ask: "How bad is the itch?"    - MILD: doesn't interfere with normal activities   - MODERATE-SEVERE: interferes with work, school, sleep, or  other activities      Denies.  7. SWELLING: "How big is the swelling?" (inches, cm, or compare to coins)     Mild swelling.  8. OTHER SYMPTOMS: "Do you have any other symptoms?"  (e.g., difficulty breathing, hives)     Denies.  9. PREGNANCY: "Is there any chance you are pregnant?" "When was your last menstrual period?"     N/A.  Protocols used: Insect Bite-A-AH
